# Patient Record
Sex: Male | Born: 1970 | Race: White | Hispanic: No | Marital: Married | Smoking: Never smoker
Health system: Southern US, Community
[De-identification: ages and names within clinical notes are randomized; demographics above are authoritative.]

## PROBLEM LIST (undated history)

## (undated) DIAGNOSIS — E119 Type 2 diabetes mellitus without complications: Secondary | ICD-10-CM

## (undated) DIAGNOSIS — R413 Other amnesia: Secondary | ICD-10-CM

## (undated) DIAGNOSIS — I10 Essential (primary) hypertension: Secondary | ICD-10-CM

## (undated) DIAGNOSIS — R519 Headache, unspecified: Secondary | ICD-10-CM

## (undated) DIAGNOSIS — M1990 Unspecified osteoarthritis, unspecified site: Secondary | ICD-10-CM

## (undated) DIAGNOSIS — G629 Polyneuropathy, unspecified: Secondary | ICD-10-CM

## (undated) DIAGNOSIS — F419 Anxiety disorder, unspecified: Secondary | ICD-10-CM

## (undated) DIAGNOSIS — R51 Headache: Secondary | ICD-10-CM

## (undated) DIAGNOSIS — G4730 Sleep apnea, unspecified: Secondary | ICD-10-CM

## (undated) HISTORY — PX: CHOLECYSTECTOMY: SHX55

## (undated) HISTORY — DX: Polyneuropathy, unspecified: G62.9

## (undated) HISTORY — DX: Essential (primary) hypertension: I10

## (undated) HISTORY — DX: Anxiety disorder, unspecified: F41.9

## (undated) HISTORY — DX: Headache: R51

## (undated) HISTORY — DX: Type 2 diabetes mellitus without complications: E11.9

## (undated) HISTORY — DX: Headache, unspecified: R51.9

## (undated) HISTORY — PX: KNEE ARTHROSCOPY: SUR90

---

## 2006-02-23 ENCOUNTER — Ambulatory Visit: Payer: Self-pay | Admitting: Internal Medicine

## 2006-05-20 ENCOUNTER — Ambulatory Visit: Payer: Self-pay | Admitting: Gastroenterology

## 2006-10-27 ENCOUNTER — Ambulatory Visit: Payer: Self-pay | Admitting: Unknown Physician Specialty

## 2006-11-01 ENCOUNTER — Ambulatory Visit: Payer: Self-pay | Admitting: Unknown Physician Specialty

## 2008-10-17 ENCOUNTER — Ambulatory Visit: Payer: Self-pay | Admitting: Internal Medicine

## 2008-12-07 DIAGNOSIS — E782 Mixed hyperlipidemia: Secondary | ICD-10-CM | POA: Insufficient documentation

## 2009-08-29 DIAGNOSIS — R0602 Shortness of breath: Secondary | ICD-10-CM | POA: Insufficient documentation

## 2010-10-27 DIAGNOSIS — M5412 Radiculopathy, cervical region: Secondary | ICD-10-CM | POA: Insufficient documentation

## 2010-11-07 DIAGNOSIS — M5090 Cervical disc disorder, unspecified, unspecified cervical region: Secondary | ICD-10-CM | POA: Insufficient documentation

## 2010-11-14 DIAGNOSIS — G4733 Obstructive sleep apnea (adult) (pediatric): Secondary | ICD-10-CM | POA: Insufficient documentation

## 2011-03-06 DIAGNOSIS — R413 Other amnesia: Secondary | ICD-10-CM | POA: Insufficient documentation

## 2011-05-20 DIAGNOSIS — M542 Cervicalgia: Secondary | ICD-10-CM | POA: Insufficient documentation

## 2012-12-20 DIAGNOSIS — F4322 Adjustment disorder with anxiety: Secondary | ICD-10-CM | POA: Insufficient documentation

## 2012-12-28 DIAGNOSIS — M545 Low back pain, unspecified: Secondary | ICD-10-CM | POA: Insufficient documentation

## 2012-12-28 DIAGNOSIS — E1140 Type 2 diabetes mellitus with diabetic neuropathy, unspecified: Secondary | ICD-10-CM | POA: Insufficient documentation

## 2013-03-31 DIAGNOSIS — G988 Other disorders of nervous system: Secondary | ICD-10-CM | POA: Insufficient documentation

## 2014-02-28 DIAGNOSIS — IMO0002 Reserved for concepts with insufficient information to code with codable children: Secondary | ICD-10-CM | POA: Insufficient documentation

## 2014-06-26 DIAGNOSIS — IMO0002 Reserved for concepts with insufficient information to code with codable children: Secondary | ICD-10-CM | POA: Insufficient documentation

## 2014-10-18 DIAGNOSIS — R7989 Other specified abnormal findings of blood chemistry: Secondary | ICD-10-CM | POA: Insufficient documentation

## 2014-10-18 DIAGNOSIS — R079 Chest pain, unspecified: Secondary | ICD-10-CM | POA: Insufficient documentation

## 2014-10-18 DIAGNOSIS — R778 Other specified abnormalities of plasma proteins: Secondary | ICD-10-CM | POA: Insufficient documentation

## 2014-10-25 DIAGNOSIS — B3322 Viral myocarditis: Secondary | ICD-10-CM | POA: Insufficient documentation

## 2014-10-25 DIAGNOSIS — K120 Recurrent oral aphthae: Secondary | ICD-10-CM | POA: Insufficient documentation

## 2015-09-06 DIAGNOSIS — G894 Chronic pain syndrome: Secondary | ICD-10-CM | POA: Insufficient documentation

## 2015-09-06 DIAGNOSIS — M159 Polyosteoarthritis, unspecified: Secondary | ICD-10-CM | POA: Insufficient documentation

## 2015-09-06 DIAGNOSIS — I10 Essential (primary) hypertension: Secondary | ICD-10-CM | POA: Insufficient documentation

## 2015-09-06 DIAGNOSIS — F449 Dissociative and conversion disorder, unspecified: Secondary | ICD-10-CM | POA: Insufficient documentation

## 2015-10-01 DIAGNOSIS — M25569 Pain in unspecified knee: Secondary | ICD-10-CM | POA: Insufficient documentation

## 2015-10-03 ENCOUNTER — Other Ambulatory Visit: Payer: Self-pay | Admitting: Unknown Physician Specialty

## 2015-10-03 DIAGNOSIS — M25561 Pain in right knee: Secondary | ICD-10-CM

## 2015-10-03 DIAGNOSIS — G8929 Other chronic pain: Secondary | ICD-10-CM

## 2015-10-03 DIAGNOSIS — M25562 Pain in left knee: Secondary | ICD-10-CM

## 2015-10-24 ENCOUNTER — Ambulatory Visit
Admission: RE | Admit: 2015-10-24 | Discharge: 2015-10-24 | Disposition: A | Discharge status: Home / Self Care | Payer: Medicare HMO | Source: Ambulatory Visit | Attending: Unknown Physician Specialty | Admitting: Unknown Physician Specialty

## 2015-10-24 DIAGNOSIS — M25561 Pain in right knee: Secondary | ICD-10-CM

## 2015-10-24 DIAGNOSIS — M25562 Pain in left knee: Secondary | ICD-10-CM

## 2015-10-24 DIAGNOSIS — M1711 Unilateral primary osteoarthritis, right knee: Secondary | ICD-10-CM | POA: Insufficient documentation

## 2015-10-24 DIAGNOSIS — G8929 Other chronic pain: Secondary | ICD-10-CM | POA: Diagnosis present

## 2015-10-24 DIAGNOSIS — M1712 Unilateral primary osteoarthritis, left knee: Secondary | ICD-10-CM | POA: Diagnosis not present

## 2015-11-28 ENCOUNTER — Ambulatory Visit (INDEPENDENT_AMBULATORY_CARE_PROVIDER_SITE_OTHER): Payer: Self-pay | Admitting: Psychiatry

## 2016-01-08 ENCOUNTER — Other Ambulatory Visit: Payer: Self-pay | Admitting: Psychiatry

## 2016-01-08 ENCOUNTER — Encounter: Payer: Self-pay | Admitting: Psychiatry

## 2016-01-08 ENCOUNTER — Ambulatory Visit (INDEPENDENT_AMBULATORY_CARE_PROVIDER_SITE_OTHER): Payer: Medicare HMO | Admitting: Psychiatry

## 2016-01-08 VITALS — BP 140/78 | HR 92 | Temp 98.2°F | Ht 70.0 in | Wt 262.2 lb

## 2016-01-08 DIAGNOSIS — F331 Major depressive disorder, recurrent, moderate: Secondary | ICD-10-CM | POA: Diagnosis not present

## 2016-01-08 DIAGNOSIS — E785 Hyperlipidemia, unspecified: Secondary | ICD-10-CM | POA: Insufficient documentation

## 2016-01-08 DIAGNOSIS — F449 Dissociative and conversion disorder, unspecified: Secondary | ICD-10-CM

## 2016-01-08 MED ORDER — LAMOTRIGINE 25 MG PO TABS: 25.0000 mg | ORAL_TABLET | Freq: Every day | ORAL | Status: DC

## 2016-01-08 MED ORDER — TRAZODONE HCL 100 MG PO TABS: 100.0000 mg | ORAL_TABLET | Freq: Every day | ORAL | Status: DC

## 2016-01-08 NOTE — Progress Notes (Signed)
Psychiatric Initial Adult Assessment   Patient Identification: Brian Howell MRN:  [id number] Date of Evaluation:  01/08/2016 Referral Source: PCP Chief Complaint:   Chief Complaint    Establish Care; Anxiety     Visit Diagnosis:    ICD-9-CM ICD-10-CM   1. MDD (major depressive disorder), recurrent episode, moderate (HCC) 296.32 F33.1   2. 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 He reported that he was referred by his primary care physician. Patient reported that he has history of memory issues and he was diagnosed with dissociative disorder in 2010 after he fell off the truck. Initial history was provided by his wife but then the patient started providing the history. Patient stated that he has recently started following Dr. Doy Hutching after he has received disability for his memory problems. He was also following Dr. Johnn Hai at [facility] for the past one year who has given him Ritalin due to his low energy. He stated that he has problems with his memory and he will lose portions of his memory at times. He will go back in time and will talk about past when he will have episodes of dissociation. Patient reported that he will not have issues with memory most of the time but will have episodes lasting for 2-2-1/2 hour then he will talk about past, does not remember the names of his family members and will be lost. Patient reported that he has never gone out of the city while driving. He reported that he currently remembers his name age date of birth. He has never assumed a new identity and started a new relationship. Patient does not have multiple personalities. He also has never traveled out of state all gone too far from his home. He reported that he is able to drive by himself with the help of GPS. He was seen at [facility] for many years and they have diagnosed  him with depression and he was seeing a therapist over there as well.  His wife reported that they were sending him to a neurologist who did multiple test and only diagnosed him with dissociative disorder. Patient denied any history of seizures or suicide attempts.  Associated Signs/Symptoms: Depression Symptoms:  anhedonia, fatigue, difficulty concentrating, hopelessness, impaired memory, (Hypo) Manic Symptoms:  Labiality of Mood, Anxiety Symptoms:  Excessive Worry, Psychotic Symptoms:  none PTSD Symptoms: Negative NA  Past Psychiatric History:  Patient has history of dissociative disorder, depression and anxiety. He does not have any history of suicide attempts. He was seen at [facility] in the past. His last psychiatrist was Dr. Jacqualine Code. He was prescribed Zoloft Remeron and Abilify in the past.  Previous Psychotropic Medications:   He was prescribed Zoloft Remeron and Abilify in the past   Substance Abuse History in the last 12 months:  No.  Consequences of Substance Abuse: Negative NA  Past Medical History:  Past Medical History  Diagnosis Date  . Neuropathy ([place])   . Anxiety   . Diabetes mellitus, type II ([place])   . Headache   . Hypertension     Past Surgical History  Procedure Laterality Date  . Cholecystectomy      Family Psychiatric History:  History of bipolar disorder  Family History:  Family History  Problem Relation Age of Onset  . Anxiety disorder Mother   . Depression Mother   . Diabetes Mother   .  Heart attack Father   . Stroke Father   . Bipolar disorder Sister   . Diabetes Sister   . Diabetes Brother   . Diabetes Sister   . Stroke Sister   . Heart attack Sister     Social History:   Social History   Social History  . Marital Status: Married    Spouse Name: N/A  . Number of Children: N/A  . Years of Education: N/A   Social History Main Topics  . Smoking status: Never Smoker   . Smokeless tobacco: Never Used  .  Alcohol Use: No  . Drug Use: No  . Sexual Activity: No   Other Topics Concern  . None   Social History Narrative  . None    Additional Social History:   married for 29 years. He has 4 children ages 64, 85, 53 and 20 years old. He reported that he used to work in [employer]. Currently he is on disability and spends time watching TV.  Allergies:   Allergies  Allergen Reactions  . Ace Inhibitors Swelling    Metabolic Disorder Labs: No results found for: HGBA1C, MPG No results found for: PROLACTIN No results found for: CHOL, TRIG, HDL, CHOLHDL, VLDL, LDLCALC   Current Medications: Current Outpatient Prescriptions  Medication Sig Dispense Refill  . aspirin 81 MG tablet Take 81 mg by mouth.    . canagliflozin (INVOKANA) 300 MG TABS tablet Take by mouth.    . DULoxetine (CYMBALTA) 30 MG capsule Take by mouth.    . gabapentin (NEURONTIN) 300 MG capsule Take 900 mg by mouth 3 (three) times daily.    Marland Kitchen glucose blood (FREESTYLE INSULINX TEST) test strip     . HYDROcodone-acetaminophen (NORCO/VICODIN) 5-325 MG tablet TK 2 TS PO Q 8 H PRN P  0  . insulin NPH-regular Human (HUMULIN 70/30) (70-30) 100 UNIT/ML injection Inject into the skin.    . Insulin Syringe-Needle U-100 (INSULIN SYRINGE .3CC/29GX1/2") 29G X 1/2" 0.3 ML MISC     . lidocaine (XYLOCAINE) 2 % solution 20 mg.    . metFORMIN (GLUCOPHAGE) 1000 MG tablet Take 1,000 mg by mouth 2 (two) times daily.    . methylphenidate (RITALIN) 20 MG tablet Take 20 mg by mouth. Take one and half tablet    . ONETOUCH DELICA LANCETS FINE MISC     . traZODone (DESYREL) 50 MG tablet Take by mouth.     No current facility-administered medications for this visit.    Neurologic: Headache: No Seizure: No Paresthesias:No  Musculoskeletal: Strength & Muscle Tone: within normal limits Gait & Station: normal Patient leans: N/A  Psychiatric Specialty Exam: ROS  Blood pressure 140/78, pulse 92, temperature 98.2 F (36.8 C), temperature  source Tympanic, height 5\' 10"  (1.778 m), weight 262 lb 3.2 oz (118.933 kg), SpO2 94 %.Body mass index is 37.62 kg/(m^2).  General Appearance: Casual  Eye Contact:  Fair  Speech:  Slow  Volume:  Normal  Mood:  Anxious  Affect:  Congruent  Thought Process:  Goal Directed  Orientation:  Full (Time, Place, and Person)  Thought Content:  WDL  Suicidal Thoughts:  No  Homicidal Thoughts:  No  Memory:  Immediate;   Fair  Judgement:  Fair  Insight:  Fair  Psychomotor Activity:  Normal  Concentration:  Fair  Recall:  [employer] of [place]  Language: Fair  Akathisia:  No  Handed:  Right  AIMS (if indicated):    Assets:  Communication Skills Leisure Time Physical Health  Social Support  ADL's:  Intact  Cognition: WNL  Sleep:      Treatment Plan Summary: Medication management Patient does not seem to have any acute memory problems at this time. I will start him on lamotrigine 25 mg daily to help with his mood symptoms. I will titrate trazodone 100 mg at bedtime to help with his insomnia I also reviewed his records including his old records from his [facility] He will sign a release of information from [facility] Discussed with him about the medications in detail and he demonstrated understanding Follow-up in 3 weeks or earlier   More than 50% of the time spent in psychoeducation, counseling and coordination of care.    This note was generated in part or whole with voice recognition software. Voice regonition is usually quite accurate but there are transcription errors that can and very often do occur. I apologize for any typographical errors that were not detected and corrected.   Rainey Pines, MD 5/17/20171:30 PM

## 2016-01-29 ENCOUNTER — Other Ambulatory Visit: Payer: Self-pay | Admitting: Psychiatry

## 2016-01-29 ENCOUNTER — Encounter: Payer: Self-pay | Admitting: Psychiatry

## 2016-01-29 ENCOUNTER — Ambulatory Visit (INDEPENDENT_AMBULATORY_CARE_PROVIDER_SITE_OTHER): Payer: Medicare HMO | Admitting: Psychiatry

## 2016-01-29 VITALS — BP 145/87 | HR 89 | Temp 98.3°F | Ht 71.0 in | Wt 262.8 lb

## 2016-01-29 DIAGNOSIS — F331 Major depressive disorder, recurrent, moderate: Secondary | ICD-10-CM | POA: Diagnosis not present

## 2016-01-29 DIAGNOSIS — F449 Dissociative and conversion disorder, unspecified: Secondary | ICD-10-CM | POA: Diagnosis not present

## 2016-01-29 MED ORDER — TRAZODONE HCL 100 MG PO TABS: 100.0000 mg | ORAL_TABLET | Freq: Every evening | ORAL | Status: DC | PRN

## 2016-01-29 MED ORDER — ESCITALOPRAM OXALATE 10 MG PO TABS: 10.0000 mg | ORAL_TABLET | Freq: Every day | ORAL | Status: DC

## 2016-01-29 MED ORDER — LAMOTRIGINE 25 MG PO TABS: 50.0000 mg | ORAL_TABLET | Freq: Every day | ORAL | Status: DC

## 2016-01-29 NOTE — Progress Notes (Signed)
Psychiatric MD Progress Note   Patient Identification: Brian Howell MRN:  [id number] Date of Evaluation:  01/29/2016 Referral Source: PCP Chief Complaint:    Visit Diagnosis:    ICD-9-CM ICD-10-CM   1. MDD (major depressive disorder), recurrent episode, moderate (HCC) 296.32 F33.1   2. 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  He reported that he was referred by his primary care physician. Patient reported that he has history of memory issues and he Continues to feel that he is in a"  glass box." He was diagnosed with dissociative disorder in 2010 after he fell off the truck. He reported that he has problems with his memory but he was able to talk about his day  and was able to provide me information how he spends his time at home. Patient also told me that he was taking Ritalin and Zoloft in the past. He reported that he enjoys cooking at home for sometime he will put extra salt in the food and everybody will tell him that he is putting extra salt. He reported that he used to cope in the past and he continues to enjoy cooking. He reported that he also stays longer in the bed as he has nothing to do.  He stated that he spends most of the time on the computer and sometimes he likes to stay her in the space.  His wife has been taking him to the restaurant 3-4 times per week and occasionally he will feel paranoid as if everybody is looking at him. He feels anxious in social situations and his wife has been encouraging him that he should go out more often.  Patient does not seem to have any memory issues as he has been able to provide most of the history by himself.  He reported that he was prescribed Ritalin in the past . He currently sleeps well with the help of gabapentin and trazodone.  He currently denied having any suicidal ideations or plans.  Associated Signs/Symptoms: Depression Symptoms:   anhedonia, fatigue, difficulty concentrating, hopelessness, impaired memory, (Hypo) Manic Symptoms:  Labiality of Mood, Anxiety Symptoms:  Excessive Worry, Psychotic Symptoms:  none PTSD Symptoms: Negative NA  Past Psychiatric History:  Patient has history of dissociative disorder, depression and anxiety. He does not have any history of suicide attempts. He was seen at [facility] in the past. His last psychiatrist was Dr. Jacqualine Code. He was prescribed Zoloft Remeron and Abilify in the past.  Previous Psychotropic Medications:   He was prescribed Zoloft Remeron and Abilify in the past   Substance Abuse History in the last 12 months:  No.  Consequences of Substance Abuse: Negative NA  Past Medical History:  Past Medical History  Diagnosis Date  . Neuropathy ([place])   . Anxiety   . Diabetes mellitus, type II ([place])   . Headache   . Hypertension     Past Surgical History  Procedure Laterality Date  . Cholecystectomy      Family Psychiatric History:  History of bipolar disorder  Family History:  Family History  Problem Relation Age of Onset  . Anxiety disorder Mother   . Depression Mother   . Diabetes Mother   . Heart attack Father   . Stroke Father   . Bipolar disorder Sister   . Diabetes Sister   . Diabetes Brother   . Diabetes Sister   . Stroke  Sister   . Heart attack Sister     Social History:   Social History   Social History  . Marital Status: Married    Spouse Name: N/A  . Number of Children: N/A  . Years of Education: N/A   Social History Main Topics  . Smoking status: Never Smoker   . Smokeless tobacco: Never Used  . Alcohol Use: No  . Drug Use: No  . Sexual Activity: No   Other Topics Concern  . None   Social History Narrative    Additional Social History:   married for 29 years. He has 4 children ages 15, 60, 2 and 37 years old. He reported that he used to work in [employer]. Currently he is on disability and spends time  watching TV.  Allergies:   Allergies  Allergen Reactions  . Ace Inhibitors Swelling    Metabolic Disorder Labs: No results found for: HGBA1C, MPG No results found for: PROLACTIN No results found for: CHOL, TRIG, HDL, CHOLHDL, VLDL, LDLCALC   Current Medications: Current Outpatient Prescriptions  Medication Sig Dispense Refill  . aspirin 81 MG tablet Take 81 mg by mouth.    . canagliflozin (INVOKANA) 300 MG TABS tablet Take by mouth.    . gabapentin (NEURONTIN) 300 MG capsule Take 900 mg by mouth 3 (three) times daily.    Marland Kitchen glucose blood (FREESTYLE INSULINX TEST) test strip     . HYDROcodone-acetaminophen (NORCO/VICODIN) 5-325 MG tablet TK 2 TS PO Q 8 H PRN P  0  . insulin NPH-regular Human (HUMULIN 70/30) (70-30) 100 UNIT/ML injection Inject into the skin.    . Insulin Syringe-Needle U-100 (INSULIN SYRINGE .3CC/29GX1/2") 29G X 1/2" 0.3 ML MISC     . lamoTRIgine (LAMICTAL) 25 MG tablet Take 1 tablet (25 mg total) by mouth daily. 30 tablet 0  . lidocaine (XYLOCAINE) 2 % solution 20 mg.    . metFORMIN (GLUCOPHAGE) 1000 MG tablet Take 1,000 mg by mouth 2 (two) times daily.    Glory Rosebush DELICA LANCETS FINE MISC     . traZODone (DESYREL) 100 MG tablet Take 1 tablet (100 mg total) by mouth at bedtime. 30 tablet 0   No current facility-administered medications for this visit.    Neurologic: Headache: No Seizure: No Paresthesias:No  Musculoskeletal: Strength & Muscle Tone: within normal limits Gait & Station: normal Patient leans: N/A  Psychiatric Specialty Exam: ROS   Blood pressure 145/87, pulse 89, temperature 98.3 F (36.8 C), height 5\' 11"  (1.803 m), weight 262 lb 12.8 oz (119.205 kg), SpO2 95 %.Body mass index is 36.67 kg/(m^2).  General Appearance: Casual  Eye Contact:  Fair  Speech:  Slow  Volume:  Normal  Mood:  Anxious  Affect:  Congruent  Thought Process:  Goal Directed  Orientation:  Full (Time, Place, and Person)  Thought Content:  WDL  Suicidal Thoughts:   No  Homicidal Thoughts:  No  Memory:  Immediate;   Fair  Judgement:  Fair  Insight:  Fair  Psychomotor Activity:  Normal  Concentration:  Fair  Recall:  [employer] of Knowledge:Fair  Language: Fair  Akathisia:  No  Handed:  Right  AIMS (if indicated):    Assets:  Communication Skills Leisure Time Physical Health Social Support  ADL's:  Intact  Cognition: WNL  Sleep:      Treatment Plan Summary: Medication management  I will start him on lamotrigine 50  mg daily to help with his mood symptoms. I will titrate trazodone  100 mg at bedtime to help with his insomnia I will start him on Lexapro 10 mg daily to help with his depression and anxiety symptoms.   Discussed with him about the medications in detail and he demonstrated understanding Follow-up in 2 months  or earlier   More than 50% of the time spent in psychoeducation, counseling and coordination of care.    This note was generated in part or whole with voice recognition software. Voice regonition is usually quite accurate but there are transcription errors that can and very often do occur. I apologize for any typographical errors that were not detected and corrected.   Rainey Pines, MD 6/7/20172:42 PM

## 2016-01-31 ENCOUNTER — Other Ambulatory Visit: Payer: Self-pay | Admitting: Psychiatry

## 2016-03-23 ENCOUNTER — Other Ambulatory Visit: Payer: Self-pay | Admitting: Psychiatry

## 2016-03-23 ENCOUNTER — Other Ambulatory Visit: Payer: Self-pay

## 2016-03-23 MED ORDER — ESCITALOPRAM OXALATE 10 MG PO TABS: 10.0000 mg | ORAL_TABLET | Freq: Every day | ORAL | 0 refills | Status: DC

## 2016-03-23 NOTE — Telephone Encounter (Signed)
pt needs refill on lexapro pt will not have enough medication to lastpt needs refill on lexapro pt will not have enough medication to last until appt.  until appt.

## 2016-04-06 ENCOUNTER — Ambulatory Visit: Payer: Medicare HMO | Admitting: Psychiatry

## 2016-04-10 ENCOUNTER — Other Ambulatory Visit: Payer: Self-pay | Admitting: Internal Medicine

## 2016-04-10 DIAGNOSIS — R55 Syncope and collapse: Secondary | ICD-10-CM

## 2016-04-21 ENCOUNTER — Ambulatory Visit: Payer: Medicare HMO

## 2016-04-29 ENCOUNTER — Ambulatory Visit: Payer: Medicare HMO

## 2016-05-05 ENCOUNTER — Other Ambulatory Visit: Payer: Self-pay | Admitting: Physical Medicine and Rehabilitation

## 2016-05-05 DIAGNOSIS — M5417 Radiculopathy, lumbosacral region: Secondary | ICD-10-CM

## 2016-05-14 ENCOUNTER — Ambulatory Visit
Admission: RE | Admit: 2016-05-14 | Discharge: 2016-05-14 | Disposition: A | Discharge status: Home / Self Care | Payer: Medicare HMO | Source: Ambulatory Visit | Attending: Physical Medicine and Rehabilitation | Admitting: Physical Medicine and Rehabilitation

## 2016-05-14 DIAGNOSIS — M5416 Radiculopathy, lumbar region: Secondary | ICD-10-CM | POA: Insufficient documentation

## 2016-05-14 DIAGNOSIS — M4806 Spinal stenosis, lumbar region: Secondary | ICD-10-CM | POA: Insufficient documentation

## 2016-05-14 DIAGNOSIS — M5126 Other intervertebral disc displacement, lumbar region: Secondary | ICD-10-CM | POA: Diagnosis not present

## 2016-05-14 DIAGNOSIS — M5136 Other intervertebral disc degeneration, lumbar region: Secondary | ICD-10-CM | POA: Insufficient documentation

## 2016-05-14 DIAGNOSIS — M5417 Radiculopathy, lumbosacral region: Secondary | ICD-10-CM

## 2017-03-09 ENCOUNTER — Other Ambulatory Visit: Payer: Self-pay | Admitting: Internal Medicine

## 2017-03-09 DIAGNOSIS — R0602 Shortness of breath: Secondary | ICD-10-CM

## 2017-12-14 ENCOUNTER — Other Ambulatory Visit (HOSPITAL_COMMUNITY): Payer: Self-pay | Admitting: Nurse Practitioner

## 2017-12-14 DIAGNOSIS — G3184 Mild cognitive impairment, so stated: Secondary | ICD-10-CM

## 2017-12-21 ENCOUNTER — Ambulatory Visit (HOSPITAL_COMMUNITY)
Admission: RE | Admit: 2017-12-21 | Discharge: 2017-12-21 | Disposition: A | Discharge status: Home / Self Care | Payer: Medicare HMO | Source: Ambulatory Visit | Attending: Nurse Practitioner | Admitting: Nurse Practitioner

## 2017-12-21 DIAGNOSIS — G3184 Mild cognitive impairment, so stated: Secondary | ICD-10-CM | POA: Diagnosis not present

## 2017-12-22 ENCOUNTER — Other Ambulatory Visit: Payer: Self-pay | Admitting: Internal Medicine

## 2017-12-22 DIAGNOSIS — R140 Abdominal distension (gaseous): Secondary | ICD-10-CM

## 2018-01-07 ENCOUNTER — Ambulatory Visit
Admission: RE | Admit: 2018-01-07 | Discharge: 2018-01-07 | Disposition: A | Discharge status: Home / Self Care | Payer: Medicare HMO | Source: Ambulatory Visit | Attending: Internal Medicine | Admitting: Internal Medicine

## 2018-01-07 ENCOUNTER — Other Ambulatory Visit: Payer: Self-pay | Admitting: Internal Medicine

## 2018-01-07 DIAGNOSIS — Z9049 Acquired absence of other specified parts of digestive tract: Secondary | ICD-10-CM | POA: Insufficient documentation

## 2018-01-07 DIAGNOSIS — R140 Abdominal distension (gaseous): Secondary | ICD-10-CM | POA: Insufficient documentation

## 2018-01-07 DIAGNOSIS — R109 Unspecified abdominal pain: Secondary | ICD-10-CM | POA: Diagnosis present

## 2018-09-01 ENCOUNTER — Other Ambulatory Visit: Payer: Self-pay | Admitting: Podiatry

## 2018-09-01 ENCOUNTER — Other Ambulatory Visit (HOSPITAL_COMMUNITY): Payer: Self-pay | Admitting: Podiatry

## 2018-09-01 DIAGNOSIS — S86001D Unspecified injury of right Achilles tendon, subsequent encounter: Secondary | ICD-10-CM

## 2018-09-11 ENCOUNTER — Encounter (INDEPENDENT_AMBULATORY_CARE_PROVIDER_SITE_OTHER): Payer: Self-pay

## 2018-09-11 ENCOUNTER — Ambulatory Visit
Admission: RE | Admit: 2018-09-11 | Discharge: 2018-09-11 | Disposition: A | Discharge status: Home / Self Care | Payer: Medicare HMO | Source: Ambulatory Visit | Attending: Podiatry | Admitting: Podiatry

## 2018-09-11 DIAGNOSIS — S86001D Unspecified injury of right Achilles tendon, subsequent encounter: Secondary | ICD-10-CM | POA: Insufficient documentation

## 2019-04-05 ENCOUNTER — Other Ambulatory Visit: Payer: Self-pay | Admitting: Internal Medicine

## 2019-04-05 DIAGNOSIS — G959 Disease of spinal cord, unspecified: Secondary | ICD-10-CM

## 2019-04-19 ENCOUNTER — Other Ambulatory Visit: Payer: Self-pay

## 2019-04-19 ENCOUNTER — Ambulatory Visit
Admission: RE | Admit: 2019-04-19 | Discharge: 2019-04-19 | Disposition: A | Discharge status: Home / Self Care | Payer: Medicare HMO | Source: Ambulatory Visit | Attending: Internal Medicine | Admitting: Internal Medicine

## 2019-04-19 DIAGNOSIS — G959 Disease of spinal cord, unspecified: Secondary | ICD-10-CM | POA: Diagnosis not present

## 2019-08-27 ENCOUNTER — Other Ambulatory Visit: Payer: Self-pay

## 2019-08-27 ENCOUNTER — Emergency Department
Admission: EM | Admit: 2019-08-27 | Discharge: 2019-08-27 | Disposition: A | Discharge status: Home / Self Care | Payer: Medicare HMO | Attending: Emergency Medicine | Admitting: Emergency Medicine

## 2019-08-27 ENCOUNTER — Encounter: Payer: Self-pay | Admitting: Emergency Medicine

## 2019-08-27 DIAGNOSIS — K0889 Other specified disorders of teeth and supporting structures: Secondary | ICD-10-CM | POA: Insufficient documentation

## 2019-08-27 DIAGNOSIS — R220 Localized swelling, mass and lump, head: Secondary | ICD-10-CM | POA: Insufficient documentation

## 2019-08-27 DIAGNOSIS — Z5321 Procedure and treatment not carried out due to patient leaving prior to being seen by health care provider: Secondary | ICD-10-CM | POA: Diagnosis not present

## 2019-08-27 MED ORDER — ACETAMINOPHEN 325 MG PO TABS
650.0000 mg | ORAL_TABLET | Freq: Once | ORAL | Status: AC | PRN
  Administered 2019-08-27: 650 mg via ORAL
  Filled 2019-08-27: qty 2

## 2019-08-27 NOTE — ED Triage Notes (Signed)
Pt arrived via POV with reports of dental pain and facial swelling x 3 days, pt is diabetic also.  Pt called [facility] yesterday and was prescribed Augmentin, states he took 3 doses, but states its not any better.  Facial swelling present on the right side.

## 2019-08-29 ENCOUNTER — Other Ambulatory Visit: Payer: Self-pay

## 2019-08-29 ENCOUNTER — Ambulatory Visit
Admission: RE | Admit: 2019-08-29 | Discharge: 2019-08-29 | Disposition: A | Discharge status: Home / Self Care | Payer: Medicare HMO | Source: Ambulatory Visit | Attending: Physician Assistant | Admitting: Physician Assistant

## 2019-08-29 ENCOUNTER — Other Ambulatory Visit: Payer: Self-pay | Admitting: Physician Assistant

## 2019-08-29 ENCOUNTER — Other Ambulatory Visit (HOSPITAL_COMMUNITY): Payer: Self-pay | Admitting: Physician Assistant

## 2019-08-29 DIAGNOSIS — L03211 Cellulitis of face: Secondary | ICD-10-CM

## 2019-08-29 DIAGNOSIS — K047 Periapical abscess without sinus: Secondary | ICD-10-CM

## 2019-08-29 LAB — POCT I-STAT CREATININE: Creatinine, Ser: 0.8 mg/dL (ref 0.61–1.24)

## 2019-08-29 MED ORDER — IOHEXOL 300 MG/ML  SOLN
75.0000 mL | Freq: Once | INTRAMUSCULAR | Status: AC | PRN
  Administered 2019-08-29: 75 mL via INTRAVENOUS

## 2019-09-06 ENCOUNTER — Other Ambulatory Visit: Payer: Self-pay

## 2019-09-06 ENCOUNTER — Ambulatory Visit: Payer: Medicare HMO | Admitting: Urology

## 2019-09-06 ENCOUNTER — Encounter: Payer: Self-pay | Admitting: Urology

## 2019-09-06 VITALS — BP 107/70 | HR 68 | Ht 70.0 in | Wt 270.0 lb

## 2019-09-06 DIAGNOSIS — R361 Hematospermia: Secondary | ICD-10-CM

## 2019-09-06 DIAGNOSIS — N5201 Erectile dysfunction due to arterial insufficiency: Secondary | ICD-10-CM | POA: Diagnosis not present

## 2019-09-06 MED ORDER — TADALAFIL 20 MG PO TABS
ORAL_TABLET | ORAL | 0 refills | Status: DC
Start: 2019-09-06 — End: 2019-10-17

## 2019-09-06 NOTE — Progress Notes (Signed)
09/06/2019 11:17 AM   Brian Howell 1971/02/04 [id number]  Referring provider: Medicine, [facility] [address] [place],  [place] [address]  Chief Complaint  Patient presents with  . Other    HPI: Brian Howell is a 49 y.o. male seen in consultation at the request of Dr. Doy Hutching for evaluation hematospermia.  Approximately 2 months ago he noted a brownish discoloration of his semen with increased odor.  There was no blood in the semen per se.  Denies gross hematuria.  He has no bothersome lower urinary tract symptoms.  PSA October 2020 was 0.29.  No pain with ejaculation or pelvic pain.  He does have difficulty achieving and maintaining an erection.  Organic risk factors include diabetes, hypertension and antihypertensive medications.  Denies previous history of urologic problems.   PMH: Past Medical History:  Diagnosis Date  . Anxiety   . Diabetes mellitus, type II ([place])   . Headache   . Hypertension   . Neuropathy     Surgical History: Past Surgical History:  Procedure Laterality Date  . CHOLECYSTECTOMY      Home Medications:  Allergies as of 09/06/2019      Reactions   Ace Inhibitors Swelling   Gabapentin Rash      Medication List       Accurate as of September 06, 2019 11:17 AM. If you have any questions, ask your nurse or doctor.        aspirin 81 MG tablet Take 81 mg by mouth.   escitalopram 10 MG tablet Commonly known as: LEXAPRO Take 1 tablet (10 mg total) by mouth daily.   FreeStyle InsuLinx Test test strip Generic drug: glucose blood   gabapentin 300 MG capsule Commonly known as: NEURONTIN Take 900 mg by mouth 3 (three) times daily.   HumuLIN 70/30 (70-30) 100 UNIT/ML injection Generic drug: insulin NPH-regular Human Inject into the skin.   HYDROcodone-acetaminophen 5-325 MG tablet Commonly known as: NORCO/VICODIN TK 2 TS PO Q 8 H PRN P   INSULIN SYRINGE .3CC/29GX1/2" 29G X 1/2" 0.3 ML Misc   Invokana 300 MG  Tabs tablet Generic drug: canagliflozin Take by mouth.   lamoTRIgine 25 MG tablet Commonly known as: LaMICtal Take 2 tablets (50 mg total) by mouth daily.   lidocaine 2 % solution Commonly known as: XYLOCAINE 20 mg.   losartan 25 MG tablet Commonly known as: COZAAR TAKE 1 TABLET BY MOUTH EVERY DAY   metFORMIN 1000 MG tablet Commonly known as: GLUCOPHAGE Take 1,000 mg by mouth 2 (two) times daily.   OneTouch Delica Lancets Fine Misc   simvastatin 20 MG tablet Commonly known as: ZOCOR TAKE 1 TABLET BY MOUTH EVERY DAY AT NIGHT   traZODone 100 MG tablet Commonly known as: DESYREL TAKE 1 TABLET BY MOUTH AT BEDTIME AS NEEDED FOR SLEEP       Allergies:  Allergies  Allergen Reactions  . Ace Inhibitors Swelling  . Gabapentin Rash    Family History: Family History  Problem Relation Age of Onset  . Anxiety disorder Mother   . Depression Mother   . Diabetes Mother   . Heart attack Father   . Stroke Father   . Bipolar disorder Sister   . Diabetes Sister   . Diabetes Brother   . Diabetes Sister   . Stroke Sister   . Heart attack Sister     Social History:  reports that he has never smoked. He has never used smokeless tobacco. He reports that he does not  drink alcohol or use drugs.  ROS: UROLOGY Frequent Urination?: No Hard to postpone urination?: No Burning/pain with urination?: No Get up at night to urinate?: No Leakage of urine?: No Urine stream starts and stops?: No Trouble starting stream?: Yes Do you have to strain to urinate?: Yes Blood in urine?: No Urinary tract infection?: No Sexually transmitted disease?: No Injury to kidneys or bladder?: No Painful intercourse?: No Weak stream?: No Erection problems?: No Penile pain?: No  Gastrointestinal Nausea?: No Vomiting?: No Indigestion/heartburn?: No Diarrhea?: No Constipation?: No  Constitutional Fever: No Night sweats?: No Weight loss?: No Fatigue?: No  Skin Skin rash/lesions?:  No Itching?: No  Eyes Blurred vision?: No Double vision?: No  Ears/Nose/Throat Sore throat?: No Sinus problems?: No  Hematologic/Lymphatic Swollen glands?: No Easy bruising?: No  Cardiovascular Leg swelling?: No Chest pain?: No  Respiratory Cough?: No Shortness of breath?: No  Endocrine Excessive thirst?: Yes  Musculoskeletal Back pain?: No Joint pain?: No  Neurological Headaches?: No Dizziness?: No  Psychologic Depression?: Yes Anxiety?: Yes  Physical Exam: BP 107/70 (BP Location: Left Arm, Patient Position: Sitting, Cuff Size: Large)   Pulse 68   Ht 5\' 10"  (1.778 m)   Wt 270 lb (122.5 kg)   BMI 38.74 kg/m   Constitutional:  Alert and oriented, No acute distress. HEENT: Onset AT, moist mucus membranes.  Trachea midline, no masses. Cardiovascular: No clubbing, cyanosis, or edema. Respiratory: Normal respiratory effort, no increased work of breathing. GI: Abdomen is soft, nontender, nondistended, no abdominal masses GU: Prostate 30 g, smooth without nodules. Lymph: No cervical or inguinal lymphadenopathy. Skin: No rashes, bruises or suspicious lesions. Neurologic: Grossly intact, no focal deficits, moving all 4 extremities. Psychiatric: Normal mood and affect.   Assessment & Plan:    - Hematospermia 49 y.o. male with brownish discoloration of his semen and no other symptoms.  PSA and DRE are normal.  Increasing the frequency of his ejaculation may resolve the discoloration which he has not done due to his ED.  - Erectile dysfunction He was interested and a PDE 5 inhibitor trial and Rx tadalafil was sent to his pharmacy.   Abbie Sons, [facility] [address], [place],  [address] [phone]

## 2019-09-06 NOTE — Addendum Note (Signed)
Addended by: Kyra Manges on: 09/06/2019 04:38 PM   Modules accepted: Orders

## 2019-10-16 IMAGING — MR MR HEAD W/O CM
10 of 11 series · 39 of 48 positions shown · non-contrast
Comparison: None.

CLINICAL DATA: Mild cognitive impairment with memory loss

EXAM:
MRI HEAD WITHOUT CONTRAST
TECHNIQUE: Multiplanar, multiecho pulse sequences of the brain and surrounding
structures were obtained without intravenous contrast.
Additionally, using NeuroQuant software a 3D volumetric analysis of
the brain was performed and is compared to a normative database
adjusted for age, gender and intracranial volume.

[Series 3: DWI · axial · 3.0mm · 0.94mm/px · z∈[-34,+108]mm · 7 of 100 slices shown (1 of 2)]
[im 1/100]
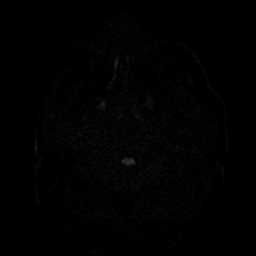
[im 17/100]
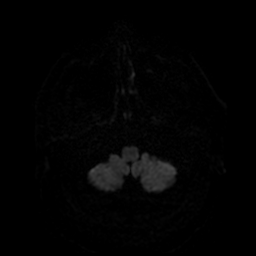
[im 34/100]
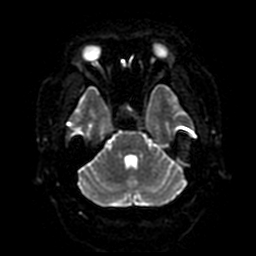
[im 50/100]
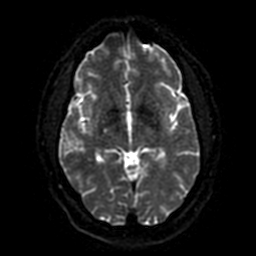
[im 67/100]
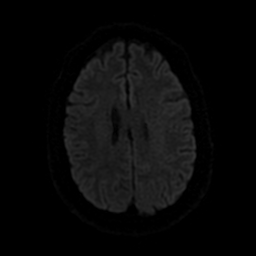
[im 83/100]
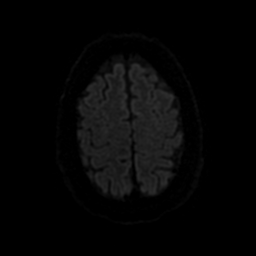
[im 100/100]
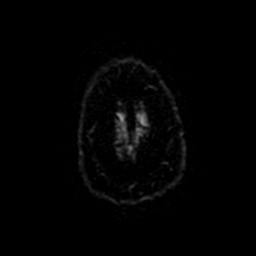

[Series 4: FLAIR · sagittal · 5.0mm · 0.47mm/px · 2 of 23 slices shown (1 of 2)]
[im 1/23]
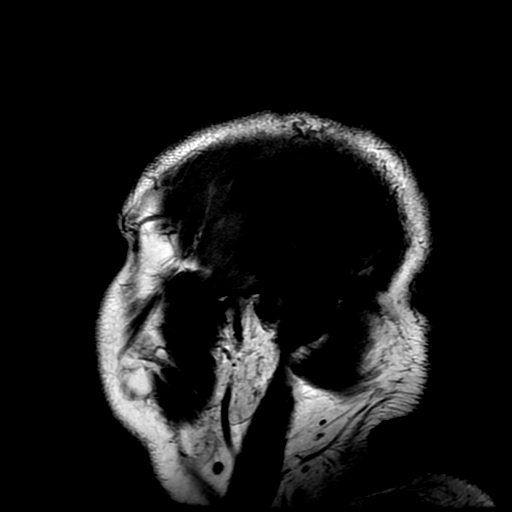
[im 23/23]
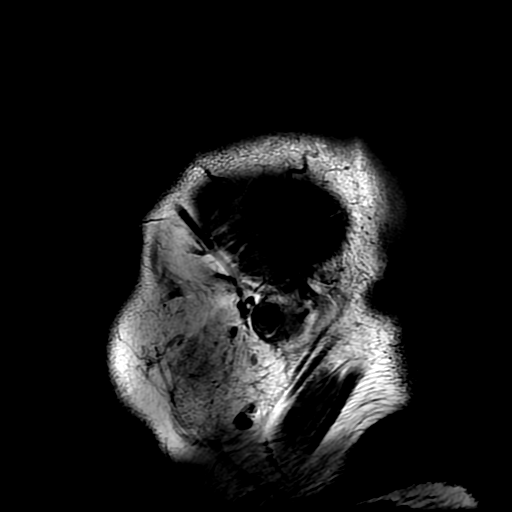

[Series 6: T1 · sagittal · 1.2mm · 0.94mm/px · 8 of 160 slices shown]
[im 1/160]
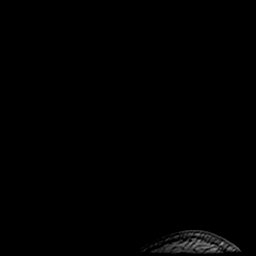
[im 32/160]
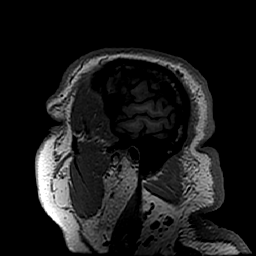
[im 48/160]
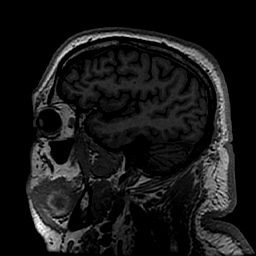
[im 64/160]
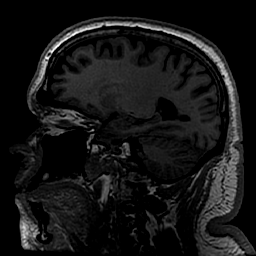
[im 96/160]
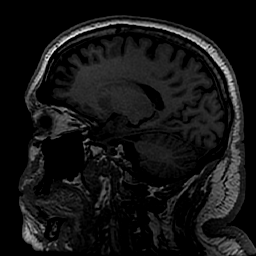
[im 112/160]
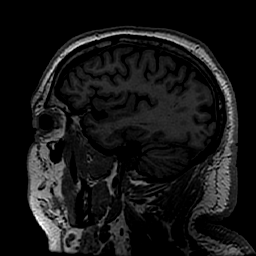
[im 128/160]
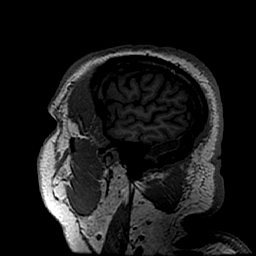
[im 160/160]
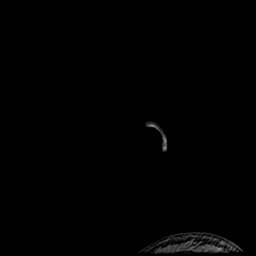

[Series 7: DWI · coronal · 4.0mm · 0.94mm/px · 5 of 72 slices shown (2 of 2)]
[im 1/72]
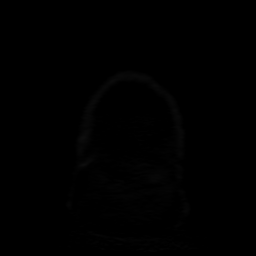
[im 18/72]
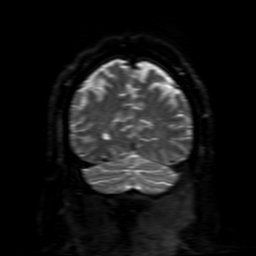
[im 36/72]
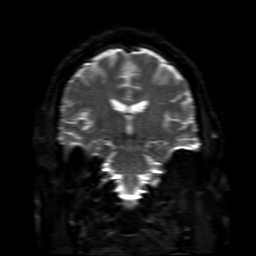
[im 54/72]
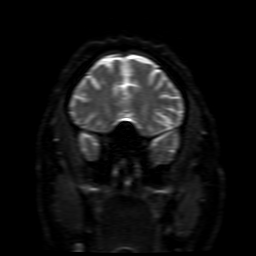
[im 72/72]
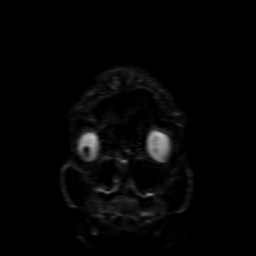

[Series 8: T2 · axial · 5.0mm · 0.47mm/px · z∈[-33,+106]mm · 2 of 25 slices shown (1 of 2)]
[im 1/25]
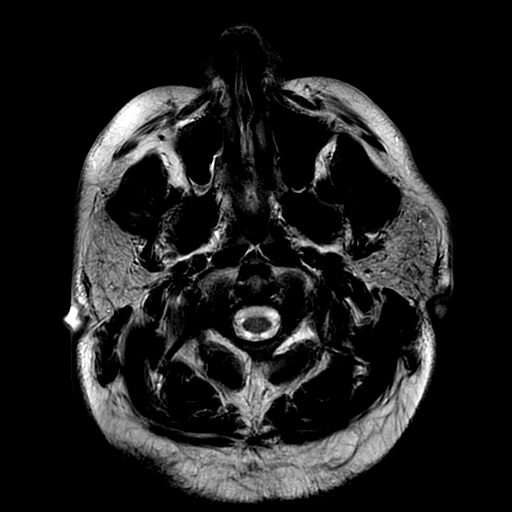
[im 25/25]
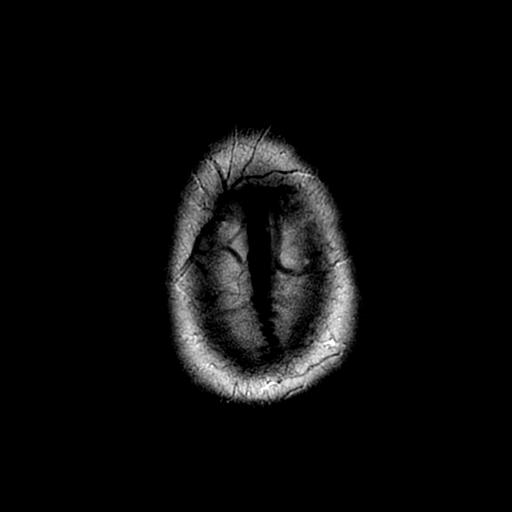

[Series 9: FLAIR · axial · 5.0mm · 0.47mm/px · z∈[-33,+106]mm · 2 of 25 slices shown (2 of 2)]
[im 1/25]
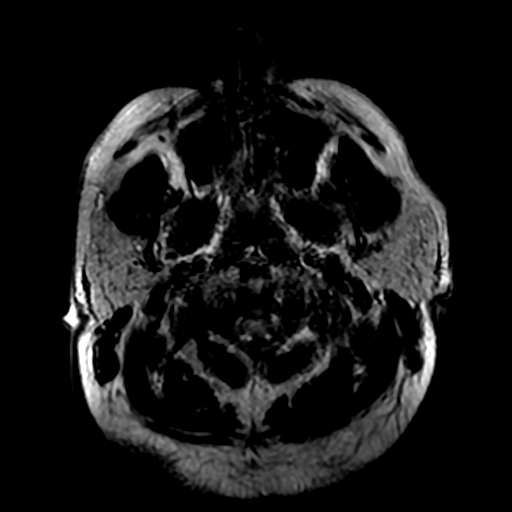
[im 25/25]
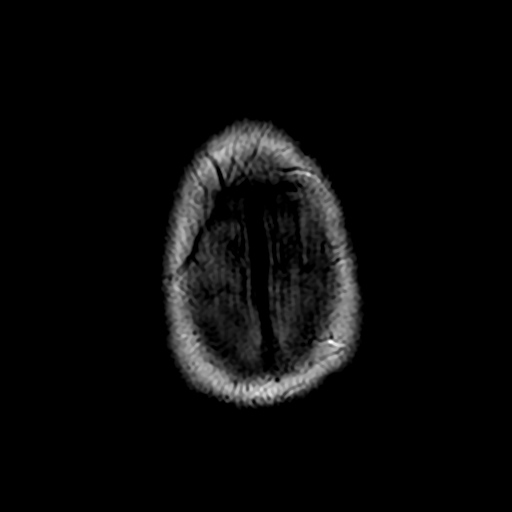

[Series 10: (person_name) · axial · 3.0mm · 0.47mm/px · z∈[-34,+37]mm · 4 of 100 slices shown]
[im 1/100]
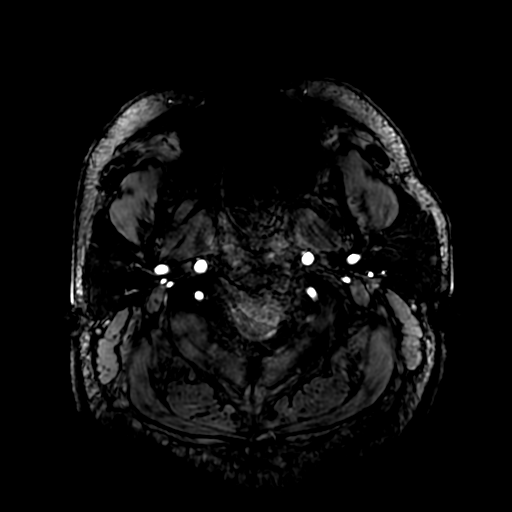
[im 17/100]
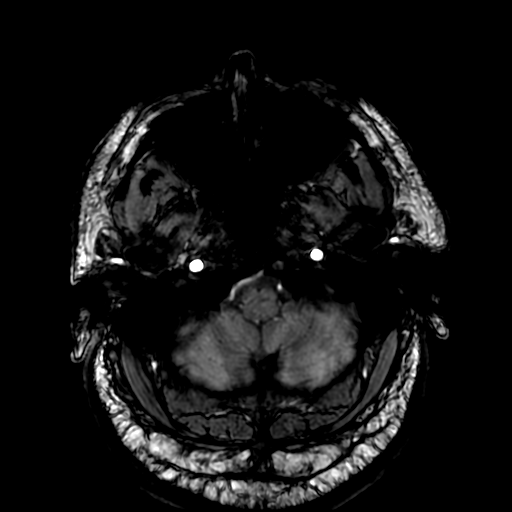
[im 34/100]
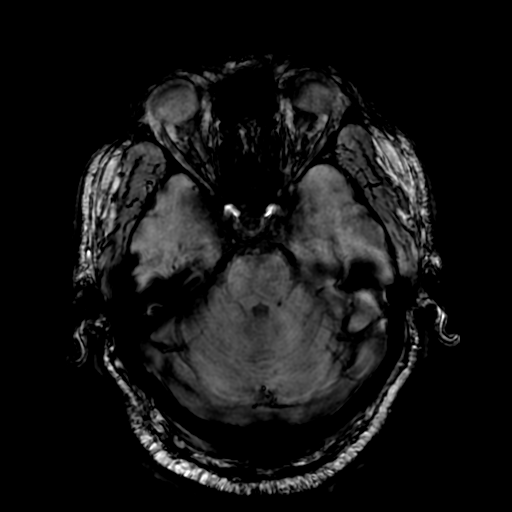
[im 50/100]
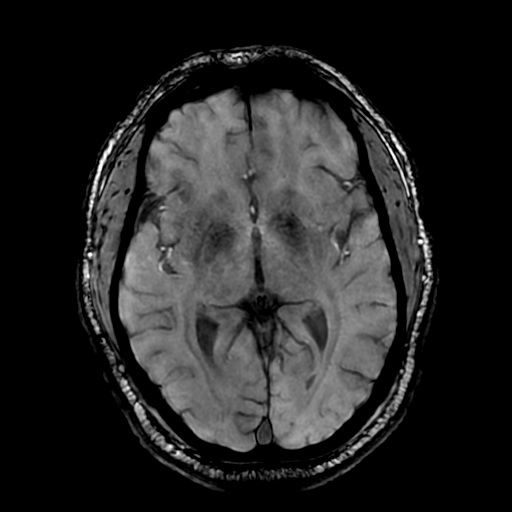

[Series 12: T2 · coronal · 5.0mm · 0.94mm/px · 2 of 30 slices shown (2 of 2)]
[im 1/30]
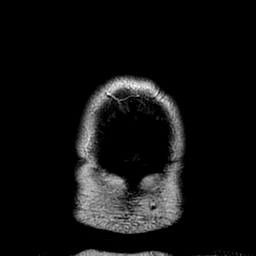
[im 30/30]
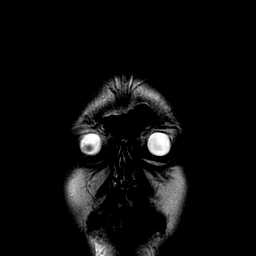

[Series 350: ADC · axial · 3.0mm · 0.94mm/px · z∈[-34,+108]mm · 4 of 49 slices shown (1 of 2)]
[im 1/49]
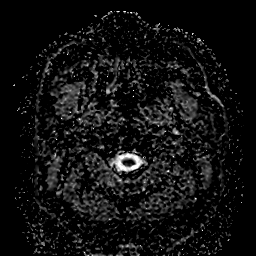
[im 17/49]
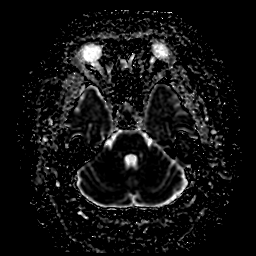
[im 33/49]
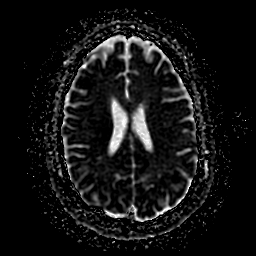
[im 49/49]
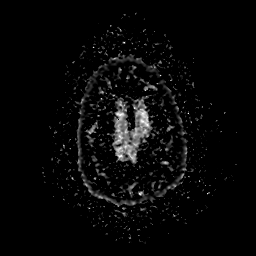

[Series 750: ADC · coronal · 4.0mm · 0.94mm/px · 3 of 36 slices shown (2 of 2)]
[im 1/36]
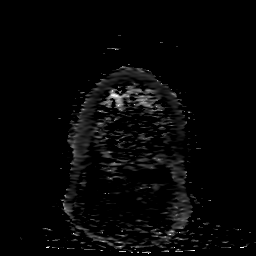
[im 18/36]
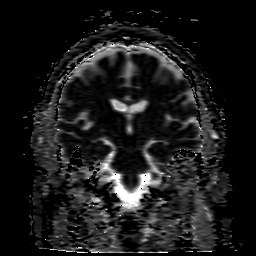
[im 36/36]
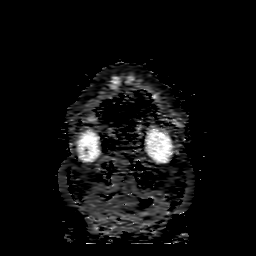

[39 of 48 positions shown; findings below may reference images not displayed]

FINDINGS: BRAIN: The midline structures are normal. There is no acute infarct
or acute hemorrhage. No mass lesion, hydrocephalus, dural
abnormality or extra-axial collection. The brain parenchymal signal
is normal for the patient's age. No chronic microhemorrhage or
superficial siderosis.

VASCULAR: Major intracranial arterial and venous sinus flow voids
are preserved.

SKULL AND UPPER CERVICAL SPINE: The visualized skull base,
calvarium, upper cervical spine and extracranial soft tissues are
normal.

SINUSES/ORBITS: Trace right mastoid fluid.  Normal orbits.

NeuroQuant Findings:

Volumetric analysis of the brain was performed, with a fully
detailed report in [HOSPITAL] PACS. Briefly, the comparison with age and
gender matched reference reveals morphometry results within two
standard deviations of age-matched average. There is artifactual
inclusion of the superior sagittal sinus in cortical volume
calculation.
IMPRESSION: 1. Qualitatively normal appearance of the brain for age.
2. NeuroQuant volumetric analysis of the brain, see details on
[HOSPITAL] PACS.

## 2019-10-17 ENCOUNTER — Other Ambulatory Visit: Payer: Self-pay | Admitting: Urology

## 2019-11-07 ENCOUNTER — Other Ambulatory Visit: Payer: Self-pay | Admitting: Physician Assistant

## 2019-11-07 DIAGNOSIS — G8929 Other chronic pain: Secondary | ICD-10-CM

## 2019-11-07 DIAGNOSIS — M1711 Unilateral primary osteoarthritis, right knee: Secondary | ICD-10-CM

## 2019-11-07 DIAGNOSIS — M25561 Pain in right knee: Secondary | ICD-10-CM

## 2019-11-17 ENCOUNTER — Other Ambulatory Visit: Payer: Self-pay

## 2019-11-17 ENCOUNTER — Ambulatory Visit
Admission: RE | Admit: 2019-11-17 | Discharge: 2019-11-17 | Disposition: A | Discharge status: Home / Self Care | Payer: Medicare HMO | Source: Ambulatory Visit | Attending: Physician Assistant | Admitting: Physician Assistant

## 2019-11-17 DIAGNOSIS — M1711 Unilateral primary osteoarthritis, right knee: Secondary | ICD-10-CM | POA: Diagnosis not present

## 2019-11-17 DIAGNOSIS — G8929 Other chronic pain: Secondary | ICD-10-CM | POA: Insufficient documentation

## 2019-11-17 DIAGNOSIS — M25561 Pain in right knee: Secondary | ICD-10-CM | POA: Insufficient documentation

## 2020-03-26 NOTE — Discharge Instructions (Signed)
Instructions after Total Knee Replacement   Brian Howell P. Takoda Janowiak, [name], M.D.     Dept. of Orthopaedics & Sports Medicine  [facility]  [address]  [place], [place]  [address]  Phone: [phone]   Fax: [phone]    DIET: Drink plenty of non-alcoholic fluids. Resume your normal diet. Include foods high in fiber.  ACTIVITY:  You may use crutches or a walker with weight-bearing as tolerated, unless instructed otherwise. You may be weaned off of the walker or crutches by your Physical Therapist.  Do NOT place pillows under the knee. Anything placed under the knee could limit your ability to straighten the knee.   Continue doing gentle exercises. Exercising will reduce the pain and swelling, increase motion, and prevent muscle weakness.   Please continue to use the TED compression stockings for 6 weeks. You may remove the stockings at night, but should reapply them in the morning. Do not drive or operate any equipment until instructed.  WOUND CARE:  Continue to use the PolarCare or ice packs periodically to reduce pain and swelling. You may bathe or shower after the staples are removed at the first office visit following surgery.  MEDICATIONS: You may resume your regular medications. Please take the pain medication as prescribed on the medication. Do not take pain medication on an empty stomach. You have been given a prescription for a blood thinner (Lovenox or Coumadin). Please take the medication as instructed. (NOTE: After completing a 2 week course of Lovenox, take one Enteric-coated aspirin once a day. This along with elevation will help reduce the possibility of phlebitis in your operated leg.) Do not drive or drink alcoholic beverages when taking pain medications.  CALL THE OFFICE FOR: Temperature above 101 degrees Excessive bleeding or drainage on the dressing. Excessive swelling, coldness, or paleness of the toes. Persistent nausea and vomiting.  FOLLOW-UP:  You  should have an appointment to return to the office in 10-14 days after surgery. Arrangements have been made for continuation of Physical Therapy (either home therapy or outpatient therapy).   [facility] Department Directory         [email or web address]       [email or web address]          Cardiology  Appointments: [place] - [phone] [place] - [phone]  Endocrinology  Appointments: [place] - [phone] [place] - [phone]  Gastroenterology  Appointments: [place] - [phone] [place] - [phone]        General Surgery   Appointments: [place] - [phone]  Internal Medicine/Family Medicine  Appointments: [place] - [phone] [place] - [phone] [place] - [phone]  Metabolic and Weigh Loss Surgery  Appointments: [place] - [phone]        Neurology  Appointments: [place] - [phone] [place] - [phone]  Neurosurgery  Appointments: [place] - [phone]  Obstetrics & Gynecology  Appointments: [place] - [phone] [place] - [phone]        Pediatrics  Appointments: [place] - [phone] [place] - [phone]  Physiatry  Appointments: [place] -[phone]  Physical Therapy  Appointments: [place] - [phone] [place] - [phone]        Podiatry  Appointments: [place] - [phone] [place] - [phone]  Pulmonology  Appointments: [place] - [phone]  Rheumatology  Appointments: [place] - [phone]         Location: [facility]  [address] , [place]  [address]  [place] Location: [facility] [address] [place], [place]  [address]  [place] Location: [facility] [address] [place], [place]  [address]    

## 2020-04-01 ENCOUNTER — Other Ambulatory Visit: Admission: RE | Admit: 2020-04-01 | Payer: Medicare Other | Source: Ambulatory Visit

## 2020-04-03 ENCOUNTER — Inpatient Hospital Stay: Admission: RE | Admit: 2020-04-03 | Payer: Medicare Other | Source: Home / Self Care | Admitting: Orthopedic Surgery

## 2020-04-03 ENCOUNTER — Encounter: Admission: RE | Payer: Self-pay | Source: Home / Self Care

## 2020-04-03 SURGERY — ARTHROPLASTY, KNEE, TOTAL, USING IMAGELESS COMPUTER-ASSISTED NAVIGATION
Anesthesia: Choice | Site: Knee | Laterality: Right

## 2020-05-24 ENCOUNTER — Encounter: Admission: RE | Payer: Self-pay | Source: Home / Self Care

## 2020-05-24 ENCOUNTER — Inpatient Hospital Stay: Admission: RE | Admit: 2020-05-24 | Payer: Medicare Other | Source: Home / Self Care | Admitting: Orthopedic Surgery

## 2020-05-24 SURGERY — ARTHROPLASTY, KNEE, TOTAL, USING IMAGELESS COMPUTER-ASSISTED NAVIGATION
Anesthesia: Choice | Site: Knee | Laterality: Right

## 2020-06-07 ENCOUNTER — Other Ambulatory Visit: Payer: Self-pay

## 2020-06-07 ENCOUNTER — Other Ambulatory Visit: Payer: Self-pay | Admitting: Physician Assistant

## 2020-06-07 ENCOUNTER — Ambulatory Visit
Admission: RE | Admit: 2020-06-07 | Discharge: 2020-06-07 | Disposition: A | Discharge status: Home / Self Care | Payer: Medicare Other | Source: Ambulatory Visit | Attending: Physician Assistant | Admitting: Physician Assistant

## 2020-06-07 DIAGNOSIS — S0990XA Unspecified injury of head, initial encounter: Secondary | ICD-10-CM

## 2020-06-09 NOTE — Discharge Instructions (Signed)
Instructions after Total Knee Replacement   [name] [name], [name], M.D.     Dept. of Orthopaedics & Sports Medicine  [facility]  [address]  [place], [place]  [address]  Phone: [phone]   Fax: [phone]    DIET: Drink plenty of non-alcoholic fluids. Resume your normal diet. Include foods high in fiber.  ACTIVITY:  You may use crutches or a walker with weight-bearing as tolerated, unless instructed otherwise. You may be weaned off of the walker or crutches by your Physical Therapist.  Do NOT place pillows under the knee. Anything placed under the knee could limit your ability to straighten the knee.   Continue doing gentle exercises. Exercising will reduce the pain and swelling, increase motion, and prevent muscle weakness.   Please continue to use the TED compression stockings for 6 weeks. You may remove the stockings at night, but should reapply them in the morning. Do not drive or operate any equipment until instructed.  WOUND CARE:  Continue to use the PolarCare or ice packs periodically to reduce pain and swelling. You may bathe or shower after the staples are removed at the first office visit following surgery.  MEDICATIONS: You may resume your regular medications. Please take the pain medication as prescribed on the medication. Do not take pain medication on an empty stomach. You have been given a prescription for a blood thinner (Lovenox or Coumadin). Please take the medication as instructed. (NOTE: After completing a 2 week course of Lovenox, take one Enteric-coated aspirin once a day. This along with elevation will help reduce the possibility of phlebitis in your operated leg.) Do not drive or drink alcoholic beverages when taking pain medications.  CALL THE OFFICE FOR: Temperature above 101 degrees Excessive bleeding or drainage on the dressing. Excessive swelling, coldness, or paleness of the toes. Persistent nausea and vomiting.  FOLLOW-UP:  You  should have an appointment to return to the office in 10-14 days after surgery. Arrangements have been made for continuation of Physical Therapy (either home therapy or outpatient therapy).   [facility] Department Directory         [email or web address]       [email or web address]          Cardiology  Appointments: [place] - [phone] [place] - [phone]  Endocrinology  Appointments: [place] - [phone] [place] - [phone]  Gastroenterology  Appointments: [place] - [phone] [place] - [phone]        General Surgery   Appointments: [place] - [phone]  Internal Medicine/Family Medicine  Appointments: [place] - [phone] [place] - [phone] [place] - [phone]  Metabolic and Weigh Loss Surgery  Appointments: [place] - [phone]        Neurology  Appointments: [place] - [phone] [place] - [phone]  Neurosurgery  Appointments: [place] - [phone]  Obstetrics & Gynecology  Appointments: [place] - [phone] [place] - [phone]        Pediatrics  Appointments: [place] - [phone] [place] - [phone]  Physiatry  Appointments: [place] -[phone]  Physical Therapy  Appointments: [place] - [phone] [place] - [phone]        Podiatry  Appointments: [place] - [phone] [place] - [phone]  Pulmonology  Appointments: [place] - [phone]  Rheumatology  Appointments: [place] - [phone]        [place] Location: [facility]  [address] [place], [place]  [address]  [place] Location: [facility] [address] [place], [place]  [address]  [place] Location: [facility] [address] [place],   [address]    

## 2020-06-13 ENCOUNTER — Other Ambulatory Visit: Payer: Self-pay

## 2020-06-13 ENCOUNTER — Encounter
Admission: RE | Admit: 2020-06-13 | Discharge: 2020-06-13 | Disposition: A | Discharge status: Home / Self Care | Payer: Medicare Other | Source: Ambulatory Visit | Attending: Orthopedic Surgery | Admitting: Orthopedic Surgery

## 2020-06-13 DIAGNOSIS — Z01812 Encounter for preprocedural laboratory examination: Secondary | ICD-10-CM | POA: Insufficient documentation

## 2020-06-13 DIAGNOSIS — Z20822 Contact with and (suspected) exposure to covid-19: Secondary | ICD-10-CM | POA: Diagnosis not present

## 2020-06-13 HISTORY — DX: Sleep apnea, unspecified: G47.30

## 2020-06-13 HISTORY — DX: Other amnesia: R41.3

## 2020-06-13 HISTORY — DX: Unspecified osteoarthritis, unspecified site: M19.90

## 2020-06-13 LAB — URINALYSIS, ROUTINE W REFLEX MICROSCOPIC
Bilirubin Urine: NEGATIVE
Glucose, UA: NEGATIVE mg/dL
Hgb urine dipstick: NEGATIVE
Ketones, ur: NEGATIVE mg/dL
Leukocytes,Ua: NEGATIVE
Nitrite: NEGATIVE
Protein, ur: NEGATIVE mg/dL
Specific Gravity, Urine: 1.017 (ref 1.005–1.030)
pH: 5 (ref 5.0–8.0)

## 2020-06-13 LAB — COMPREHENSIVE METABOLIC PANEL
ALT: 23 U/L (ref 0–44)
AST: 21 U/L (ref 15–41)
Albumin: 4 g/dL (ref 3.5–5.0)
Alkaline Phosphatase: 71 U/L (ref 38–126)
Anion gap: 9 (ref 5–15)
BUN: 12 mg/dL (ref 6–20)
CO2: 28 mmol/L (ref 22–32)
Calcium: 9.4 mg/dL (ref 8.9–10.3)
Chloride: 100 mmol/L (ref 98–111)
Creatinine, Ser: 0.66 mg/dL (ref 0.61–1.24)
GFR, Estimated: >60 mL/min (ref >60)
Glucose, Bld: 130 mg/dL — ABNORMAL HIGH (ref 70–99)
Potassium: 3.7 mmol/L (ref 3.5–5.1)
Sodium: 137 mmol/L (ref 135–145)
Total Bilirubin: 0.9 mg/dL (ref 0.3–1.2)
Total Protein: 7 g/dL (ref 6.5–8.1)

## 2020-06-13 LAB — CBC
HCT: 39.6 % (ref 39.0–52.0)
Hemoglobin: 13 g/dL (ref 13.0–17.0)
MCH: 27.6 pg (ref 26.0–34.0)
MCHC: 32.8 g/dL (ref 30.0–36.0)
MCV: 84.1 fL (ref 80.0–100.0)
Platelets: 196 10*3/uL (ref 150–400)
RBC: 4.71 MIL/uL (ref 4.22–5.81)
RDW: 13.9 % (ref 11.5–15.5)
WBC: 7.6 10*3/uL (ref 4.0–10.5)
nRBC: 0 % (ref 0.0–0.2)

## 2020-06-13 LAB — C-REACTIVE PROTEIN: CRP: 0.7 mg/dL (ref <1.0)

## 2020-06-13 LAB — APTT: aPTT: 32 seconds (ref 24–36)

## 2020-06-13 LAB — TYPE AND SCREEN
ABO/RH(D): A POS
Antibody Screen: NEGATIVE

## 2020-06-13 LAB — PROTIME-INR
INR: 1 (ref 0.8–1.2)
Prothrombin Time: 12.8 seconds (ref 11.4–15.2)

## 2020-06-13 LAB — SURGICAL PCR SCREEN
MRSA, PCR: NEGATIVE
Staphylococcus aureus: NEGATIVE

## 2020-06-13 LAB — SEDIMENTATION RATE: Sed Rate: 14 mm/hr (ref 0–20)

## 2020-06-13 LAB — SARS CORONAVIRUS 2 (TAT 6-24 HRS): SARS Coronavirus 2: NEGATIVE

## 2020-06-13 NOTE — Patient Instructions (Signed)
Your procedure is scheduled on: Mon 10/25 Report to Day Surgery. To find out your arrival time please call [phone] between 1PM - 3PM on Friday 10/22.  Remember: Instructions that are not followed completely may result in serious medical risk,  up to and including death, or upon the discretion of your surgeon and anesthesiologist your  surgery may need to be rescheduled.     _X__ 1. Do not eat food after midnight the night before your procedure.                 No chewing gum or hard candies. You may drink clear liquids up to 2 hours                 before you are scheduled to arrive for your surgery- DO not drink clear                 liquids within 2 hours of the start of your surgery.                 Clear Liquids include:  water,clear Gatorade, G2 or                  Gatorade Zero (avoid Red/Purple/Blue), Black Coffee or Tea (Do not add                 anything to coffee or tea). _____2.   Complete the**If you       are diabetic you will be provided with an alternative drink, Gatorade Zero or G2. Complete 2 hours before you arrive  __X__2.  On the morning of surgery brush your teeth with toothpaste and water, you                may rinse your mouth with mouthwash if you wish.  Do not swallow any toothpaste of mouthwash.     ___ 3.  No Alcohol for 24 hours before or after surgery.   ___ 4.  Do Not Smoke or use e-cigarettes For 24 Hours Prior to Your Surgery.                 Do not use any chewable tobacco products for at least 6 hours prior to                 Surgery.  ___  5.  Do not use any recreational drugs (marijuana, cocaine, heroin, ecstasy, MDMA or other)                For at least one week prior to your surgery.  Combination of these drugs with anesthesia                May have life threatening results.  ____  6.  Bring all medications with you on the day of surgery if instructed.   _x___  7.  Notify your doctor if there is any change in your  medical condition      (cold, fever, infections).     Do not wear jewelry, Do not wear lotions, You may wear deodorant. Do not shave 48 hours prior to surgery. Men may shave face and neck. Do not bring valuables to the hospital.    [facility] is not responsible for any belongings or valuables.  Contacts, dentures or bridgework may not be worn into surgery. Leave your suitcase in the car. After surgery it may be brought to your room. For patients admitted to the hospital, discharge time is  determined by your treatment team.   Patients discharged the day of surgery will not be allowed to drive home.   Make arrangements for someone to be with you for the first 24 hours of your Same Day Discharge.    Please read over the following fact sheets that you were given:    __x__ Take these medicines the morning of surgery with A SIP OF WATER:    1. acetaminophen (TYLENOL) 500 MG tablet or Oxycodone HCl 10 MG TABS if needed  2. DULoxetine (CYMBALTA) 60 MG capsule  3. hydrOXYzine (ATARAX/VISTARIL) 25 MG tablet  4.  5.  6.  ____ Fleet Enema (as directed)   _x___ Use CHG Soap (or wipes) as directed  ____ Use Benzoyl Peroxide Gel as instructed  ____ Use inhalers on the day of surgery  __x__ Stop metformin 2 days prior to surgery Last dose on 10/22   _x___ Take 1/2 of usual insulin dose the night before surgery. No insulin the morning          of surgery.   __x__ Stopped  aspirin on 10/14  __x__ Stopped Anti-inflammatories ibuprofen on 10/14   __x__ Stopped supplements until after surgery.  Melatonin already  _x___ Bring C-Pap to the hospital.    If you have any questions regarding your pre-procedure instructions,  Please call Pre-admit Testing at [place]

## 2020-06-15 LAB — URINE CULTURE
Culture: NO GROWTH
Special Requests: NORMAL

## 2020-06-17 ENCOUNTER — Encounter: Payer: Self-pay | Admitting: Orthopedic Surgery

## 2020-06-17 ENCOUNTER — Inpatient Hospital Stay: Payer: Medicare Other | Admitting: Anesthesiology

## 2020-06-17 ENCOUNTER — Inpatient Hospital Stay: Payer: Medicare Other

## 2020-06-17 ENCOUNTER — Encounter
Admission: RE | Disposition: A | Discharge status: Home Health | Payer: Self-pay | Source: Home / Self Care | Attending: Orthopedic Surgery

## 2020-06-17 ENCOUNTER — Inpatient Hospital Stay
Admission: RE | Admit: 2020-06-17 | Discharge: 2020-06-19 | DRG: 470 | Disposition: A | Discharge status: Home Health | Payer: Medicare Other | Attending: Orthopedic Surgery | Admitting: Orthopedic Surgery

## 2020-06-17 ENCOUNTER — Other Ambulatory Visit: Payer: Self-pay

## 2020-06-17 DIAGNOSIS — Z6839 Body mass index (BMI) 39.0-39.9, adult: Secondary | ICD-10-CM | POA: Diagnosis not present

## 2020-06-17 DIAGNOSIS — Z794 Long term (current) use of insulin: Secondary | ICD-10-CM

## 2020-06-17 DIAGNOSIS — E669 Obesity, unspecified: Secondary | ICD-10-CM | POA: Diagnosis present

## 2020-06-17 DIAGNOSIS — G43909 Migraine, unspecified, not intractable, without status migrainosus: Secondary | ICD-10-CM | POA: Diagnosis present

## 2020-06-17 DIAGNOSIS — F449 Dissociative and conversion disorder, unspecified: Secondary | ICD-10-CM | POA: Diagnosis present

## 2020-06-17 DIAGNOSIS — G4733 Obstructive sleep apnea (adult) (pediatric): Secondary | ICD-10-CM | POA: Diagnosis present

## 2020-06-17 DIAGNOSIS — E1140 Type 2 diabetes mellitus with diabetic neuropathy, unspecified: Secondary | ICD-10-CM | POA: Diagnosis present

## 2020-06-17 DIAGNOSIS — Z833 Family history of diabetes mellitus: Secondary | ICD-10-CM | POA: Diagnosis not present

## 2020-06-17 DIAGNOSIS — Z9049 Acquired absence of other specified parts of digestive tract: Secondary | ICD-10-CM

## 2020-06-17 DIAGNOSIS — J45909 Unspecified asthma, uncomplicated: Secondary | ICD-10-CM | POA: Diagnosis present

## 2020-06-17 DIAGNOSIS — Z96659 Presence of unspecified artificial knee joint: Secondary | ICD-10-CM

## 2020-06-17 DIAGNOSIS — M1711 Unilateral primary osteoarthritis, right knee: Principal | ICD-10-CM | POA: Diagnosis present

## 2020-06-17 DIAGNOSIS — F419 Anxiety disorder, unspecified: Secondary | ICD-10-CM | POA: Diagnosis present

## 2020-06-17 DIAGNOSIS — I252 Old myocardial infarction: Secondary | ICD-10-CM

## 2020-06-17 DIAGNOSIS — F32A Depression, unspecified: Secondary | ICD-10-CM | POA: Diagnosis present

## 2020-06-17 DIAGNOSIS — Z9119 Patient's noncompliance with other medical treatment and regimen: Secondary | ICD-10-CM | POA: Diagnosis not present

## 2020-06-17 DIAGNOSIS — Z8249 Family history of ischemic heart disease and other diseases of the circulatory system: Secondary | ICD-10-CM

## 2020-06-17 DIAGNOSIS — Z888 Allergy status to other drugs, medicaments and biological substances status: Secondary | ICD-10-CM | POA: Diagnosis not present

## 2020-06-17 DIAGNOSIS — Z79899 Other long term (current) drug therapy: Secondary | ICD-10-CM | POA: Diagnosis not present

## 2020-06-17 DIAGNOSIS — I10 Essential (primary) hypertension: Secondary | ICD-10-CM | POA: Diagnosis present

## 2020-06-17 DIAGNOSIS — E785 Hyperlipidemia, unspecified: Secondary | ICD-10-CM | POA: Diagnosis present

## 2020-06-17 HISTORY — PX: KNEE ARTHROPLASTY: SHX992

## 2020-06-17 LAB — GLUCOSE, CAPILLARY
Glucose-Capillary: 183 mg/dL — ABNORMAL HIGH (ref 70–99)
Glucose-Capillary: 241 mg/dL — ABNORMAL HIGH (ref 70–99)
Glucose-Capillary: 285 mg/dL — ABNORMAL HIGH (ref 70–99)
Glucose-Capillary: 404 mg/dL — ABNORMAL HIGH (ref 70–99)

## 2020-06-17 LAB — ABO/RH: ABO/RH(D): A POS

## 2020-06-17 SURGERY — ARTHROPLASTY, KNEE, TOTAL, USING IMAGELESS COMPUTER-ASSISTED NAVIGATION
Anesthesia: Spinal | Site: Knee | Laterality: Right

## 2020-06-17 MED ORDER — FUROSEMIDE 20 MG PO TABS
20.0000 mg | ORAL_TABLET | Freq: Every day | ORAL | Status: DC | PRN
  Filled 2020-06-17: qty 1

## 2020-06-17 MED ORDER — SIMVASTATIN 20 MG PO TABS
20.0000 mg | ORAL_TABLET | Freq: Every day | ORAL | Status: DC
  Administered 2020-06-17 – 2020-06-18 (×2): 20 mg via ORAL
  Filled 2020-06-17 (×3): qty 1

## 2020-06-17 MED ORDER — METOCLOPRAMIDE HCL 10 MG PO TABS
10.0000 mg | ORAL_TABLET | Freq: Three times a day (TID) | ORAL | Status: DC
  Administered 2020-06-17 – 2020-06-19 (×6): 10 mg via ORAL
  Filled 2020-06-17 (×8): qty 1

## 2020-06-17 MED ORDER — CEFAZOLIN SODIUM-DEXTROSE 2-4 GM/100ML-% IV SOLN
INTRAVENOUS | Status: AC
  Filled 2020-06-17: qty 100

## 2020-06-17 MED ORDER — INSULIN ASPART 100 UNIT/ML ~~LOC~~ SOLN: 55.0000 [IU] | Freq: Two times a day (BID) | SUBCUTANEOUS | Status: DC

## 2020-06-17 MED ORDER — INSULIN ASPART 100 UNIT/ML ~~LOC~~ SOLN
SUBCUTANEOUS | Status: AC
  Filled 2020-06-17: qty 1

## 2020-06-17 MED ORDER — TRANEXAMIC ACID-NACL 1000-0.7 MG/100ML-% IV SOLN
INTRAVENOUS | Status: AC
  Filled 2020-06-17: qty 100

## 2020-06-17 MED ORDER — HYDROXYZINE HCL 25 MG PO TABS
25.0000 mg | ORAL_TABLET | Freq: Three times a day (TID) | ORAL | Status: DC
  Administered 2020-06-17 – 2020-06-19 (×5): 25 mg via ORAL
  Filled 2020-06-17 (×8): qty 1

## 2020-06-17 MED ORDER — CEFAZOLIN SODIUM-DEXTROSE 2-4 GM/100ML-% IV SOLN
2.0000 g | Freq: Four times a day (QID) | INTRAVENOUS | Status: AC
  Administered 2020-06-17: 2 g via INTRAVENOUS
  Filled 2020-06-17: qty 100

## 2020-06-17 MED ORDER — ACETAMINOPHEN 325 MG PO TABS: 325.0000 mg | ORAL_TABLET | Freq: Four times a day (QID) | ORAL | Status: DC | PRN

## 2020-06-17 MED ORDER — SODIUM CHLORIDE 0.9 % IV SOLN: INTRAVENOUS | Status: DC

## 2020-06-17 MED ORDER — SURGIPHOR WOUND IRRIGATION SYSTEM - OPTIME
TOPICAL | Status: DC | PRN
  Administered 2020-06-17: 450 mL via TOPICAL

## 2020-06-17 MED ORDER — FLEET ENEMA 7-19 GM/118ML RE ENEM: 1.0000 | ENEMA | Freq: Once | RECTAL | Status: DC | PRN

## 2020-06-17 MED ORDER — PROPOFOL 10 MG/ML IV BOLUS
INTRAVENOUS | Status: DC | PRN
  Administered 2020-06-17 (×3): 30 mg via INTRAVENOUS
  Administered 2020-06-17: 20 mg via INTRAVENOUS
  Administered 2020-06-17: 30 mg via INTRAVENOUS

## 2020-06-17 MED ORDER — LIDOCAINE HCL (CARDIAC) PF 100 MG/5ML IV SOSY
PREFILLED_SYRINGE | INTRAVENOUS | Status: DC | PRN
  Administered 2020-06-17: 100 mg via INTRAVENOUS

## 2020-06-17 MED ORDER — ORAL CARE MOUTH RINSE: 15.0000 mL | Freq: Once | OROMUCOSAL | Status: DC

## 2020-06-17 MED ORDER — DEXAMETHASONE SODIUM PHOSPHATE 10 MG/ML IJ SOLN: 8.0000 mg | Freq: Once | INTRAMUSCULAR | Status: DC

## 2020-06-17 MED ORDER — CHLORHEXIDINE GLUCONATE 4 % EX LIQD: 60.0000 mL | Freq: Once | CUTANEOUS | Status: DC

## 2020-06-17 MED ORDER — OXYCODONE HCL 5 MG PO TABS
5.0000 mg | ORAL_TABLET | ORAL | Status: DC | PRN
  Administered 2020-06-18: 10 mg via ORAL
  Administered 2020-06-18: 5 mg via ORAL
  Administered 2020-06-19: 10 mg via ORAL
  Filled 2020-06-17 (×3): qty 2

## 2020-06-17 MED ORDER — PHENOL 1.4 % MT LIQD
1.0000 | OROMUCOSAL | Status: DC | PRN
  Filled 2020-06-17: qty 177

## 2020-06-17 MED ORDER — INSULIN DETEMIR 100 UNIT/ML ~~LOC~~ SOLN
40.0000 [IU] | Freq: Two times a day (BID) | SUBCUTANEOUS | Status: DC
  Administered 2020-06-17 – 2020-06-19 (×4): 40 [IU] via SUBCUTANEOUS
  Filled 2020-06-17 (×5): qty 0.4

## 2020-06-17 MED ORDER — CLONAZEPAM 0.5 MG PO TABS
0.5000 mg | ORAL_TABLET | Freq: Every day | ORAL | Status: DC
  Administered 2020-06-17 – 2020-06-18 (×2): 0.5 mg via ORAL
  Filled 2020-06-17 (×2): qty 1

## 2020-06-17 MED ORDER — OXYCODONE HCL 5 MG PO TABS: 5.0000 mg | ORAL_TABLET | Freq: Once | ORAL | Status: DC | PRN

## 2020-06-17 MED ORDER — INSULIN NPH (HUMAN) (ISOPHANE) 100 UNIT/ML ~~LOC~~ SUSP: 35.0000 [IU] | SUBCUTANEOUS | Status: DC

## 2020-06-17 MED ORDER — NEOMYCIN-POLYMYXIN B GU 40-200000 IR SOLN
Status: DC | PRN
  Administered 2020-06-17: 16 mL

## 2020-06-17 MED ORDER — ENSURE PRE-SURGERY PO LIQD
296.0000 mL | Freq: Once | ORAL | Status: DC
  Filled 2020-06-17: qty 296

## 2020-06-17 MED ORDER — ACETAMINOPHEN 10 MG/ML IV SOLN
1000.0000 mg | Freq: Four times a day (QID) | INTRAVENOUS | Status: AC
  Administered 2020-06-17 – 2020-06-18 (×3): 1000 mg via INTRAVENOUS
  Filled 2020-06-17 (×3): qty 100

## 2020-06-17 MED ORDER — SODIUM CHLORIDE 0.9 % IV SOLN
INTRAVENOUS | Status: DC | PRN
  Administered 2020-06-17: 60 mL

## 2020-06-17 MED ORDER — BUPIVACAINE HCL (PF) 0.5 % IJ SOLN
INTRAMUSCULAR | Status: DC | PRN
  Administered 2020-06-17: 3 mL via INTRATHECAL

## 2020-06-17 MED ORDER — INSULIN ASPART 100 UNIT/ML ~~LOC~~ SOLN
28.0000 [IU] | Freq: Every day | SUBCUTANEOUS | Status: DC
  Administered 2020-06-18: 28 [IU] via SUBCUTANEOUS
  Filled 2020-06-17: qty 1

## 2020-06-17 MED ORDER — CHLORHEXIDINE GLUCONATE 0.12 % MT SOLN: 15.0000 mL | Freq: Once | OROMUCOSAL | Status: DC

## 2020-06-17 MED ORDER — TRAZODONE HCL 100 MG PO TABS
100.0000 mg | ORAL_TABLET | Freq: Every day | ORAL | Status: DC
  Administered 2020-06-17 – 2020-06-18 (×2): 100 mg via ORAL
  Filled 2020-06-17 (×3): qty 1

## 2020-06-17 MED ORDER — ACETAMINOPHEN 10 MG/ML IV SOLN
INTRAVENOUS | Status: AC
  Filled 2020-06-17: qty 100

## 2020-06-17 MED ORDER — TIZANIDINE HCL 4 MG PO TABS
4.0000 mg | ORAL_TABLET | Freq: Three times a day (TID) | ORAL | Status: DC | PRN
  Filled 2020-06-17: qty 1

## 2020-06-17 MED ORDER — OXYCODONE HCL 5 MG PO TABS
10.0000 mg | ORAL_TABLET | Freq: Three times a day (TID) | ORAL | Status: DC
  Administered 2020-06-17 – 2020-06-19 (×5): 10 mg via ORAL
  Filled 2020-06-17: qty 1
  Filled 2020-06-17 (×2): qty 2
  Filled 2020-06-17 (×4): qty 1
  Filled 2020-06-17: qty 2
  Filled 2020-06-17: qty 1
  Filled 2020-06-17 (×2): qty 2

## 2020-06-17 MED ORDER — INSULIN REGULAR HUMAN 100 UNIT/ML IJ SOLN: 35.0000 [IU] | INTRAMUSCULAR | Status: DC

## 2020-06-17 MED ORDER — DIPHENHYDRAMINE HCL 12.5 MG/5ML PO ELIX
12.5000 mg | ORAL_SOLUTION | ORAL | Status: DC | PRN
  Filled 2020-06-17: qty 10

## 2020-06-17 MED ORDER — SODIUM CHLORIDE 0.9 % IV SOLN: INTRAVENOUS | Status: DC | PRN

## 2020-06-17 MED ORDER — PANTOPRAZOLE SODIUM 40 MG PO TBEC
40.0000 mg | DELAYED_RELEASE_TABLET | Freq: Two times a day (BID) | ORAL | Status: DC
  Administered 2020-06-17 – 2020-06-19 (×4): 40 mg via ORAL
  Filled 2020-06-17 (×5): qty 1

## 2020-06-17 MED ORDER — OXYCODONE HCL 5 MG/5ML PO SOLN: 5.0000 mg | Freq: Once | ORAL | Status: DC | PRN

## 2020-06-17 MED ORDER — CHLORHEXIDINE GLUCONATE 0.12 % MT SOLN
OROMUCOSAL | Status: AC
  Administered 2020-06-17: 15 mL via OROMUCOSAL
  Filled 2020-06-17: qty 15

## 2020-06-17 MED ORDER — ONDANSETRON HCL 4 MG PO TABS: 4.0000 mg | ORAL_TABLET | Freq: Four times a day (QID) | ORAL | Status: DC | PRN

## 2020-06-17 MED ORDER — INSULIN ASPART 100 UNIT/ML ~~LOC~~ SOLN
18.0000 [IU] | Freq: Once | SUBCUTANEOUS | Status: AC
  Administered 2020-06-17: 18 [IU] via SUBCUTANEOUS
  Filled 2020-06-17: qty 1

## 2020-06-17 MED ORDER — TRANEXAMIC ACID-NACL 1000-0.7 MG/100ML-% IV SOLN
INTRAVENOUS | Status: DC | PRN
  Administered 2020-06-17: 1000 mg via INTRAVENOUS

## 2020-06-17 MED ORDER — DULOXETINE HCL 60 MG PO CPEP
60.0000 mg | ORAL_CAPSULE | Freq: Two times a day (BID) | ORAL | Status: DC
  Administered 2020-06-17 – 2020-06-19 (×4): 60 mg via ORAL
  Filled 2020-06-17 (×6): qty 1

## 2020-06-17 MED ORDER — CEFAZOLIN SODIUM-DEXTROSE 2-4 GM/100ML-% IV SOLN: 2.0000 g | INTRAVENOUS | Status: DC

## 2020-06-17 MED ORDER — FENTANYL CITRATE (PF) 100 MCG/2ML IJ SOLN
INTRAMUSCULAR | Status: AC
  Filled 2020-06-17: qty 2

## 2020-06-17 MED ORDER — CELECOXIB 200 MG PO CAPS
200.0000 mg | ORAL_CAPSULE | Freq: Two times a day (BID) | ORAL | Status: DC
  Administered 2020-06-17 – 2020-06-19 (×4): 200 mg via ORAL
  Filled 2020-06-17 (×4): qty 1

## 2020-06-17 MED ORDER — INSULIN ASPART 100 UNIT/ML ~~LOC~~ SOLN
44.0000 [IU] | Freq: Two times a day (BID) | SUBCUTANEOUS | Status: DC
  Administered 2020-06-18 – 2020-06-19 (×3): 44 [IU] via SUBCUTANEOUS
  Filled 2020-06-17 (×3): qty 1

## 2020-06-17 MED ORDER — DEXAMETHASONE SODIUM PHOSPHATE 10 MG/ML IJ SOLN
INTRAMUSCULAR | Status: AC
  Administered 2020-06-17: 8 mg via INTRAVENOUS
  Filled 2020-06-17: qty 1

## 2020-06-17 MED ORDER — PROPOFOL 500 MG/50ML IV EMUL
INTRAVENOUS | Status: AC
  Filled 2020-06-17: qty 50

## 2020-06-17 MED ORDER — ONDANSETRON HCL 4 MG/2ML IJ SOLN: 4.0000 mg | Freq: Four times a day (QID) | INTRAMUSCULAR | Status: DC | PRN

## 2020-06-17 MED ORDER — CELECOXIB 200 MG PO CAPS: 400.0000 mg | ORAL_CAPSULE | Freq: Once | ORAL | Status: DC

## 2020-06-17 MED ORDER — PROPOFOL 500 MG/50ML IV EMUL
INTRAVENOUS | Status: DC | PRN
  Administered 2020-06-17: 80 ug/kg/min via INTRAVENOUS

## 2020-06-17 MED ORDER — POLYVINYL ALCOHOL 1.4 % OP SOLN
1.0000 [drp] | Freq: Four times a day (QID) | OPHTHALMIC | Status: DC | PRN
  Filled 2020-06-17: qty 15

## 2020-06-17 MED ORDER — ALUM & MAG HYDROXIDE-SIMETH 200-200-20 MG/5ML PO SUSP: 30.0000 mL | ORAL | Status: DC | PRN

## 2020-06-17 MED ORDER — LOSARTAN POTASSIUM 25 MG PO TABS
25.0000 mg | ORAL_TABLET | Freq: Every day | ORAL | Status: DC
  Administered 2020-06-18 – 2020-06-19 (×2): 25 mg via ORAL
  Filled 2020-06-17 (×2): qty 1

## 2020-06-17 MED ORDER — HYDROMORPHONE HCL 1 MG/ML IJ SOLN
0.5000 mg | INTRAMUSCULAR | Status: DC | PRN
  Administered 2020-06-17: 0.5 mg via INTRAVENOUS
  Filled 2020-06-17: qty 1

## 2020-06-17 MED ORDER — ONDANSETRON HCL 4 MG/2ML IJ SOLN
4.0000 mg | Freq: Once | INTRAMUSCULAR | Status: AC | PRN
  Administered 2020-06-17: 4 mg via INTRAVENOUS

## 2020-06-17 MED ORDER — ONDANSETRON HCL 4 MG/2ML IJ SOLN
INTRAMUSCULAR | Status: DC | PRN
  Administered 2020-06-17: 4 mg via INTRAVENOUS

## 2020-06-17 MED ORDER — LIDOCAINE HCL (PF) 1 % IJ SOLN
INTRAMUSCULAR | Status: DC | PRN
  Administered 2020-06-17: 1 mL

## 2020-06-17 MED ORDER — METFORMIN HCL 500 MG PO TABS
1000.0000 mg | ORAL_TABLET | Freq: Two times a day (BID) | ORAL | Status: DC
  Administered 2020-06-18 – 2020-06-19 (×3): 1000 mg via ORAL
  Filled 2020-06-17 (×4): qty 2

## 2020-06-17 MED ORDER — TRANEXAMIC ACID-NACL 1000-0.7 MG/100ML-% IV SOLN: 1000.0000 mg | INTRAVENOUS | Status: DC

## 2020-06-17 MED ORDER — CELECOXIB 200 MG PO CAPS
ORAL_CAPSULE | ORAL | Status: AC
  Administered 2020-06-17: 400 mg via ORAL
  Filled 2020-06-17: qty 2

## 2020-06-17 MED ORDER — ENOXAPARIN SODIUM 30 MG/0.3ML ~~LOC~~ SOLN
30.0000 mg | Freq: Two times a day (BID) | SUBCUTANEOUS | Status: DC
  Administered 2020-06-18 – 2020-06-19 (×3): 30 mg via SUBCUTANEOUS
  Filled 2020-06-17 (×4): qty 0.3

## 2020-06-17 MED ORDER — MENTHOL 3 MG MT LOZG
1.0000 | LOZENGE | OROMUCOSAL | Status: DC | PRN
  Filled 2020-06-17: qty 9

## 2020-06-17 MED ORDER — INSULIN ASPART 100 UNIT/ML ~~LOC~~ SOLN: 0.0000 [IU] | Freq: Three times a day (TID) | SUBCUTANEOUS | Status: DC

## 2020-06-17 MED ORDER — ACETAMINOPHEN 10 MG/ML IV SOLN
INTRAVENOUS | Status: DC | PRN
  Administered 2020-06-17: 1000 mg via INTRAVENOUS

## 2020-06-17 MED ORDER — FAMOTIDINE 20 MG PO TABS
ORAL_TABLET | ORAL | Status: AC
  Administered 2020-06-17: 20 mg via ORAL
  Filled 2020-06-17: qty 1

## 2020-06-17 MED ORDER — METFORMIN HCL 500 MG PO TABS
1000.0000 mg | ORAL_TABLET | Freq: Once | ORAL | Status: AC
  Administered 2020-06-17: 1000 mg via ORAL
  Filled 2020-06-17: qty 2

## 2020-06-17 MED ORDER — MAGNESIUM HYDROXIDE 400 MG/5ML PO SUSP
30.0000 mL | Freq: Every day | ORAL | Status: DC
  Administered 2020-06-18 – 2020-06-19 (×2): 30 mL via ORAL
  Filled 2020-06-17 (×3): qty 30

## 2020-06-17 MED ORDER — LACTATED RINGERS IV SOLN: INTRAVENOUS | Status: DC | PRN

## 2020-06-17 MED ORDER — SENNOSIDES-DOCUSATE SODIUM 8.6-50 MG PO TABS
1.0000 | ORAL_TABLET | Freq: Two times a day (BID) | ORAL | Status: DC
  Administered 2020-06-17 – 2020-06-19 (×4): 1 via ORAL
  Filled 2020-06-17 (×5): qty 1

## 2020-06-17 MED ORDER — BUPIVACAINE HCL (PF) 0.25 % IJ SOLN
INTRAMUSCULAR | Status: DC | PRN
  Administered 2020-06-17: 60 mL

## 2020-06-17 MED ORDER — DEXMEDETOMIDINE (PRECEDEX) IN NS 20 MCG/5ML (4 MCG/ML) IV SYRINGE
PREFILLED_SYRINGE | INTRAVENOUS | Status: DC | PRN
  Administered 2020-06-17: 4 ug via INTRAVENOUS

## 2020-06-17 MED ORDER — ONDANSETRON HCL 4 MG/2ML IJ SOLN
INTRAMUSCULAR | Status: AC
  Filled 2020-06-17: qty 2

## 2020-06-17 MED ORDER — FENTANYL CITRATE (PF) 100 MCG/2ML IJ SOLN: 25.0000 ug | INTRAMUSCULAR | Status: DC | PRN

## 2020-06-17 MED ORDER — FAMOTIDINE 20 MG PO TABS: 20.0000 mg | ORAL_TABLET | Freq: Once | ORAL | Status: AC

## 2020-06-17 MED ORDER — INSULIN ASPART 100 UNIT/ML ~~LOC~~ SOLN
0.0000 [IU] | Freq: Three times a day (TID) | SUBCUTANEOUS | Status: DC
  Administered 2020-06-18: 11 [IU] via SUBCUTANEOUS
  Administered 2020-06-18: 5 [IU] via SUBCUTANEOUS
  Administered 2020-06-18: 11 [IU] via SUBCUTANEOUS
  Administered 2020-06-18: 8 [IU] via SUBCUTANEOUS
  Administered 2020-06-19: 2 [IU] via SUBCUTANEOUS
  Filled 2020-06-17 (×4): qty 1

## 2020-06-17 MED ORDER — TRANEXAMIC ACID-NACL 1000-0.7 MG/100ML-% IV SOLN
1000.0000 mg | Freq: Once | INTRAVENOUS | Status: AC
  Administered 2020-06-17: 1000 mg via INTRAVENOUS

## 2020-06-17 MED ORDER — MIDAZOLAM HCL 2 MG/2ML IJ SOLN
INTRAMUSCULAR | Status: AC
  Filled 2020-06-17: qty 2

## 2020-06-17 MED ORDER — DEXTROSE 5 % IV SOLN
INTRAVENOUS | Status: DC | PRN
  Administered 2020-06-17: 3 g via INTRAVENOUS

## 2020-06-17 MED ORDER — CARBOXYMETHYLCELLUL-GLYCERIN 0.5-0.9 % OP SOLN
1.0000 [drp] | Freq: Four times a day (QID) | OPHTHALMIC | Status: DC | PRN
  Filled 2020-06-17: qty 15

## 2020-06-17 MED ORDER — FERROUS SULFATE 325 (65 FE) MG PO TABS
325.0000 mg | ORAL_TABLET | Freq: Two times a day (BID) | ORAL | Status: DC
  Administered 2020-06-18 – 2020-06-19 (×3): 325 mg via ORAL
  Filled 2020-06-17 (×4): qty 1

## 2020-06-17 MED ORDER — HYPROMELLOSE (GONIOSCOPIC) 2.5 % OP SOLN
1.0000 [drp] | Freq: Four times a day (QID) | OPHTHALMIC | Status: DC | PRN
  Filled 2020-06-17: qty 15

## 2020-06-17 MED ORDER — BISACODYL 10 MG RE SUPP
10.0000 mg | Freq: Every day | RECTAL | Status: DC | PRN
  Filled 2020-06-17: qty 1

## 2020-06-17 MED ORDER — MIDAZOLAM HCL 5 MG/5ML IJ SOLN
INTRAMUSCULAR | Status: DC | PRN
  Administered 2020-06-17: 2 mg via INTRAVENOUS

## 2020-06-17 SURGICAL SUPPLY — 76 items
ATTUNE MED DOME PAT 41 KNEE (Knees) ×1 IMPLANT
ATTUNE PS FEM RT SZ 5 CEM KNEE (Femur) ×1 IMPLANT
ATTUNE PSRP INSR SZ5 5 KNEE (Insert) ×1 IMPLANT
BASE TIBIA ATTUNE KNEE SYS SZ6 (Knees) IMPLANT
BATTERY INSTRU NAVIGATION (MISCELLANEOUS) ×8 IMPLANT
BLADE SAW 70X12.5 (BLADE) ×2 IMPLANT
BLADE SAW 90X13X1.19 OSCILLAT (BLADE) ×2 IMPLANT
BLADE SAW 90X25X1.19 OSCILLAT (BLADE) ×2 IMPLANT
BONE CEMENT GENTAMICIN (Cement) ×4 IMPLANT
CANISTER PREVENA 45 (CANNISTER) ×2 IMPLANT
CANISTER SUCT 3000ML PPV (MISCELLANEOUS) ×2 IMPLANT
CEMENT BONE GENTAMICIN 40 (Cement) IMPLANT
COOLER POLAR GLACIER W/PUMP (MISCELLANEOUS) ×2 IMPLANT
COVER WAND RF STERILE (DRAPES) ×2 IMPLANT
CUFF TOURN SGL QUICK 34 (TOURNIQUET CUFF) ×1
CUFF TRNQT CYL 34X4.125X (TOURNIQUET CUFF) IMPLANT
DRAPE 3/4 80X56 (DRAPES) ×2 IMPLANT
DRSG DERMACEA 8X12 NADH (GAUZE/BANDAGES/DRESSINGS) ×2 IMPLANT
DRSG MEPILEX SACRM 8.7X9.8 (GAUZE/BANDAGES/DRESSINGS) ×2 IMPLANT
DRSG OPSITE POSTOP 4X14 (GAUZE/BANDAGES/DRESSINGS) ×2 IMPLANT
DRSG TEGADERM 4X4.75 (GAUZE/BANDAGES/DRESSINGS) ×2 IMPLANT
DURAPREP 26ML APPLICATOR (WOUND CARE) ×4 IMPLANT
ELECT REM PT RETURN 9FT ADLT (ELECTROSURGICAL) ×2
ELECTRODE REM PT RTRN 9FT ADLT (ELECTROSURGICAL) ×1 IMPLANT
EX-PIN ORTHOLOCK NAV 4X150 (PIN) ×4 IMPLANT
GLOVE BIO SURGEON STRL SZ7.5 (GLOVE) ×4 IMPLANT
GLOVE BIOGEL M STRL SZ7.5 (GLOVE) ×4 IMPLANT
GLOVE BIOGEL PI IND STRL 7.5 (GLOVE) ×1 IMPLANT
GLOVE BIOGEL PI INDICATOR 7.5 (GLOVE) ×1
GLOVE INDICATOR 8.0 STRL GRN (GLOVE) ×2 IMPLANT
GOWN STRL REUS W/ TWL LRG LVL3 (GOWN DISPOSABLE) ×2 IMPLANT
GOWN STRL REUS W/ TWL XL LVL3 (GOWN DISPOSABLE) ×1 IMPLANT
GOWN STRL REUS W/TWL LRG LVL3 (GOWN DISPOSABLE) ×2
GOWN STRL REUS W/TWL XL LVL3 (GOWN DISPOSABLE) ×1
HEMOVAC 400CC 10FR (MISCELLANEOUS) ×2 IMPLANT
HOLDER FOLEY CATH W/STRAP (MISCELLANEOUS) ×2 IMPLANT
HOOD PEEL AWAY FLYTE STAYCOOL (MISCELLANEOUS) ×4 IMPLANT
IRRIGATION SURGIPHOR STRL (IV SOLUTION) ×2 IMPLANT
KIT TURNOVER KIT A (KITS) ×2 IMPLANT
KNIFE SCULPS 14X20 (INSTRUMENTS) ×2 IMPLANT
LABEL OR SOLS (LABEL) ×2 IMPLANT
MANIFOLD NEPTUNE II (INSTRUMENTS) ×3 IMPLANT
NDL SAFETY ECLIPSE 18X1.5 (NEEDLE) ×1 IMPLANT
NDL SPNL 20GX3.5 QUINCKE YW (NEEDLE) ×2 IMPLANT
NEEDLE HYPO 18GX1.5 SHARP (NEEDLE) ×1
NEEDLE SPNL 20GX3.5 QUINCKE YW (NEEDLE) ×4 IMPLANT
NS IRRIG 500ML POUR BTL (IV SOLUTION) ×2 IMPLANT
PACK TOTAL KNEE (MISCELLANEOUS) ×2 IMPLANT
PAD WRAPON POLOR MULTI XL (MISCELLANEOUS) IMPLANT
PENCIL SMOKE EVACUATOR COATED (MISCELLANEOUS) ×2 IMPLANT
PENCIL SMOKE ULTRAEVAC 22 CON (MISCELLANEOUS) ×2 IMPLANT
PIN DRILL QUICK PACK ×2 IMPLANT
PIN FIXATION 1/8DIA X 3INL (PIN) ×6 IMPLANT
PULSAVAC PLUS IRRIG FAN TIP (DISPOSABLE) ×2
SOL .9 NS 3000ML IRR  AL (IV SOLUTION) ×1
SOL .9 NS 3000ML IRR UROMATIC (IV SOLUTION) ×1 IMPLANT
SOL PREP PVP 2OZ (MISCELLANEOUS) ×2
SOLUTION PREP PVP 2OZ (MISCELLANEOUS) ×1 IMPLANT
SPONGE DRAIN TRACH 4X4 STRL 2S (GAUZE/BANDAGES/DRESSINGS) ×2 IMPLANT
STAPLER SKIN PROX 35W (STAPLE) ×2 IMPLANT
STOCKINETTE IMPERV 14X48 (MISCELLANEOUS) ×1 IMPLANT
STRAP TIBIA SHORT (MISCELLANEOUS) ×2 IMPLANT
SUCTION FRAZIER HANDLE 10FR (MISCELLANEOUS) ×1
SUCTION TUBE FRAZIER 10FR DISP (MISCELLANEOUS) ×1 IMPLANT
SUT VIC AB 0 CT1 36 (SUTURE) ×4 IMPLANT
SUT VIC AB 1 CT1 36 (SUTURE) ×4 IMPLANT
SUT VIC AB 2-0 CT2 27 (SUTURE) ×2 IMPLANT
SYR 20ML LL LF (SYRINGE) ×2 IMPLANT
SYR 30ML LL (SYRINGE) ×4 IMPLANT
TIBIA ATTUNE KNEE SYS BASE SZ6 (Knees) ×2 IMPLANT
TIP FAN IRRIG PULSAVAC PLUS (DISPOSABLE) ×1 IMPLANT
TOWEL OR 17X26 4PK STRL BLUE (TOWEL DISPOSABLE) ×2 IMPLANT
TOWER CARTRIDGE SMART MIX (DISPOSABLE) ×2 IMPLANT
TRAY FOLEY MTR SLVR 16FR STAT (SET/KITS/TRAYS/PACK) ×2 IMPLANT
WRAP-ON POLOR PAD MULTI XL (MISCELLANEOUS) ×1
WRAPON POLOR PAD MULTI XL (MISCELLANEOUS) ×1

## 2020-06-17 NOTE — Progress Notes (Signed)
Patient awoke and complained of right shoulder pain , ice pack applied,

## 2020-06-17 NOTE — Anesthesia Procedure Notes (Signed)
Spinal  Patient location during procedure: OR Start time: 06/17/2020 12:04 PM End time: 06/17/2020 12:08 PM Staffing Performed: resident/CRNA  Anesthesiologist: Tera Mater, MD Resident/CRNA: Jerrye Noble, CRNA Preanesthetic Checklist Completed: patient identified, IV checked, site marked, risks and benefits discussed, surgical consent, monitors and equipment checked, pre-op evaluation and timeout performed Spinal Block Patient position: sitting Prep: ChloraPrep Patient monitoring: heart rate, continuous pulse ox, blood pressure and cardiac monitor Approach: midline Location: L4-5 Injection technique: single-shot Needle Needle type: Whitacre and Introducer  Needle gauge: 24 G Needle length: 9 cm Additional Notes Negative paresthesia. Negative blood return. Positive free-flowing CSF. Expiration date of kit checked and confirmed. Patient tolerated procedure well, without complications.

## 2020-06-17 NOTE — H&P (Signed)
ORTHOPAEDIC HISTORY & PHYSICAL Progress Notes [name], [place] - 06/11/2020 3:30 PM EDT [facility] Chief Complaint:   Chief Complaint  Patient presents with   Knee Pain  H & P RIGHT KNEE   History of Present Illness:   Brian Howell is a 49 y.o. male that presents to clinic today for his preoperative history and evaluation. Patient presents with his wife. The patient is scheduled to undergo a right total knee arthroplasty on 06/17/20 by Dr. Marry Guan. His pain began several years ago. The pain is located primarily along the medial aspect of the knee. He describes his pain as worse with weightbearing. He reports associated swelling with some giving way of the knee. He denies associated numbness or tingling, denies locking.   The patient's symptoms have progressed to the point that they decrease his quality of life. The patient has previously undergone conservative treatment including NSAIDS and injections to the knee without adequate control of his symptoms.  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 He explains that this means sometimes he will wake up and not know what year it is. His wife states that the best way to handle this is to have him go back to sleep, which she states helps reset him. Denies any violence during these episodes.   Patient is an insulin dependent diabetic. His last A1C was taken 06/05/20 and was 7.1.  Denies history of blood clots. He has received cardiac clearance from Dr. Ubaldo Glassing.  Past Medical, Surgical, Family, Social History, Allergies, Medications:   Past Medical History:  Past Medical History:  Diagnosis Date   Anxiety   Arthritis   Asthma, unspecified asthma severity, unspecified whether complicated, unspecified whether persistent 2008   Depression   Dissociative disorder 09/06/2015   Hyperlipidemia 1998   Hyperlipidemia, acquired   Hypertension   Migraine    Myocardial infarction (CMS-HCC)   Myocarditis, viral 10/25/2014  Overview: Overview: Admitted 2/25-2/26 with downtrending trop LHC negative Echo- diastolic dysfunction, EF 70% Last Assessment & Plan: Sx improving minimally but he has not taken colchicine 2/2 to cost. Worked with pharmacy to figure out pharmacy assistance benefit. Patient can receive 2 weeks. -- prescribed 1 week ibuprofen 600 mg TID -- prescribed colchicine 0.6 mg TID x 1 week, then 0.6 mg   Neuropathy   Obesity (BMI 30-39.9), unspecified   Obstructive sleep apnea  not compliant with CPAP   Pain syndrome, chronic 09/06/2015   Painful diabetic neuropathy (CMS-HCC) 09/06/2015   Type 2 diabetes mellitus (CMS-HCC)   Past Surgical History:  Past Surgical History:  Procedure Laterality Date   CHOLECYSTECTOMY   KNEE ARTHROSCOPY   Current Medications:  Current Outpatient Medications  Medication Sig Dispense Refill   clonazePAM (KLONOPIN) 0.5 MG tablet Take 0.25 mg by mouth nightly as needed   cyanocobalamin (VITAMIN B12) 1,000 mcg/mL injection INJECT INTRAMUSCULARLY 1 ML AS DIRECTED ONCE MONTHLY (DISCARD 28 DAYS AFTER FIRST USE) 3 mL 5   DULoxetine (CYMBALTA) 60 MG DR capsule Take 1 capsule (60 mg total) by mouth once daily 90 capsule 3   FUROsemide (LASIX) 20 MG tablet Take 1 tablet (20 mg total) by mouth once daily as needed for Edema 90 tablet 3   glucose chew tablet 4 gram chewable tablet Take 3 tablets by mouth as needed   hydrOXYzine (ATARAX) 25 MG tablet Take 25 mg by mouth 3 (three) times daily as needed   ibuprofen (MOTRIN) 800 MG tablet TAKE 1  TABLET BY MOUTH 3 TIMES DAILY AS NEEDED 270 tablet 3   insulin NPH (HUMULIN N NPH U-100 INSULIN) injection (concentration 100 units/mL) Inject subcutaneously 80 units at 9 AM and 40 units at 9 PM 140 mL 1   insulin REGULAR (HUMULIN R REGULAR U-100 INSULN) injection (concentration 100 units/mL) INJECT SUBCUTANEOUSLY UP TO 150 UNITS DAILY IN DIVIDED DOSES AS  DIRECTED 140 mL 1   losartan (COZAAR) 25 MG tablet Take 1 tablet (25 mg total) by mouth once daily 90 tablet 3   melatonin 10 mg Cap Take 1 capsule by mouth nightly as needed   metFORMIN (GLUCOPHAGE) 1000 MG tablet Take 1 tablet (1,000 mg total) by mouth 2 (two) times daily with meals 180 tablet 3   oxyCODONE (DAZIDOX) 10 mg immediate release tablet Take 1 tablet (10 mg total) by mouth every 4 (four) hours as needed for Pain for up to 30 days 150 tablet 0   simvastatin (ZOCOR) 20 MG tablet Take 1 tablet (20 mg total) by mouth nightly 90 tablet 3   tadalafiL (CIALIS) 20 MG tablet Take 1 tablet (20 mg total) by mouth every third day as needed for Erectile Dysfunction for up to 30 days 10 tablet 3   tiZANidine (ZANAFLEX) 4 MG tablet Take 1 tablet (4 mg total) by mouth every 8 (eight) hours as needed 270 tablet 3   traZODone (DESYREL) 50 MG tablet Take 1 tablet (50 mg total) by mouth nightly 90 tablet 3   No current facility-administered medications for this visit.   Allergies:  Allergies  Allergen Reactions   Ace Inhibitors Swelling   Gabapentin Rash   Social History:  Social History   Socioeconomic History   Marital status: Married  Spouse name: Melissa   Number of children: 4   Years of education: 14   Highest education level: Not on file  Occupational History   Occupation: Disabled  Tobacco Use   Smoking status: Never Smoker   Smokeless tobacco: Never Used  Scientific laboratory technician Use: Never used  Substance and Sexual Activity   Alcohol use: No   Drug use: No   Sexual activity: Not Currently  Partners: Female  Other Topics Concern   Not on file  Social History Narrative   Not on file   Social Determinants of Health   Financial Resource Strain: Not on file  Food Insecurity: Not on file  Transportation Needs: Not on file  Physical Activity: Not on file  Stress: Not on file  Social Connections: Not on file  Housing Stability: Not on file   Family  History:  Family History  Problem Relation Age of Onset   Myocardial Infarction (Heart attack) Mother   High blood pressure (Hypertension) Mother   Stroke Mother   Coronary Artery Disease (Blocked arteries around heart) Mother   Diabetes Mother   Diabetes type II Mother   Myocardial Infarction (Heart attack) Father   High blood pressure (Hypertension) Father   Coronary Artery Disease (Blocked arteries around heart) Father   Diabetes Sister   Stroke Sister   Cancer Sister  ovarian   Coronary Artery Disease (Blocked arteries around heart) Sister   Diabetes type II Sister   High blood pressure (Hypertension) Sister   Diabetes Brother   High blood pressure (Hypertension) Brother   Diabetes Maternal Grandmother   Heart disease Maternal Grandmother   High blood pressure (Hypertension) Maternal Grandmother   Coronary Artery Disease (Blocked arteries around heart) Maternal Grandmother   Diabetes Maternal Grandfather  Heart disease Maternal Grandfather   Myocardial Infarction (Heart attack) Maternal Grandfather   High blood pressure (Hypertension) Maternal Grandfather   Myocardial Infarction (Heart attack) Paternal Grandfather   Myocardial Infarction (Heart attack) Maternal Aunt   High blood pressure (Hypertension) Maternal Aunt   Myocardial Infarction (Heart attack) Maternal Uncle   High blood pressure (Hypertension) Maternal Uncle   No Known Problems Paternal Grandmother   Diabetes type II Sister   High blood pressure (Hypertension) Sister   Review of Systems:   A 10+ ROS was performed, reviewed, and the pertinent orthopaedic findings are documented in the HPI.   Physical Examination:   BP 120/74   Ht 177.8 cm (5\' 10" )   Wt (!) 126.3 kg (278 lb 6.4 oz)   BMI 39.95 kg/m   Patient is a well-developed, well-nourished male in no acute distress. Patient has normal mood and affect. Patient is alert and oriented to person, place, and time.   HEENT:  Atraumatic, normocephalic. Pupils equal and reactive to light. Extraocular motion intact. Noninjected sclera.  Cardiovascular: Regular rate and rhythm, with no murmurs, rubs, or gallops. Distal pulses palpable.  Respiratory: Lungs clear to auscultation bilaterally.   Right Knee: Soft tissue swelling: mild Effusion: minimal Erythema: none Crepitance: mild Tenderness: medial Alignment: relative varus Mediolateral laxity: medial pseudolaxity Posterior sag: negative Patellar tracking: Good tracking without evidence of subluxation or tilt Atrophy: No significant atrophy.  Quadriceps tone was fair to good. Range of motion: 0/6/117 degrees   Sensation intact to pinprick over the lateral sural cutaneous distribution, decreased to pinprick over the saphenous and absent over the superficial fibular and deep fibular nerve distributions.  Tests Performed/Reviewed:  X-rays  No new radiographs were obtained today. Previous radiographs were reviewed of the right knee and revealed near complete loss of medial compartment joint space with mild osteophyte formation. Lateral compartment reveals mild loss of joint space with osteophyte formation. Patellofemoral joint reveals mild loss of joint space without significant osteophyte formation. No fractures or dislocations.  Impression:   ICD-10-CM  1. Primary osteoarthritis of right knee M17.11   Plan:   The patient has end-stage degenerative changes of the right knee. It was explained to the patient that the condition is progressive in nature. Having failed conservative treatment, the patient has elected to proceed with a total joint arthroplasty. The patient will undergo a total joint arthroplasty with Dr. Marry Guan. The risks of surgery, including blood clot and infection, were discussed with the patient. Measures to reduce these risks, including the use of anticoagulation, perioperative antibiotics, and early ambulation were discussed. The importance of  postoperative physical therapy was discussed with the patient. The patient elects to proceed with surgery. The patient is instructed to stop all blood thinners prior to surgery. The patient is instructed to call the hospital the day before surgery to learn of the proper arrival time.   Contact our office with any questions or concerns. Follow up as indicated, or sooner should any new problems arise, if conditions worsen, or if they are otherwise concerned.   [name], PA-C [facility] [place], [place] [address] Phone: [phone]  This note was generated in part with voice recognition software and I apologize for any typographical errors that were not detected and corrected.   Electronically signed by [name], PA at 06/11/2020 11:02 PM EDT

## 2020-06-17 NOTE — Transfer of Care (Signed)
Immediate Anesthesia Transfer of Care Note  Patient: Brian Howell  Procedure(s) Performed: COMPUTER ASSISTED TOTAL KNEE ARTHROPLASTY (Right Knee)  Patient Location: PACU  Anesthesia Type:Spinal  Level of Consciousness: awake, drowsy and patient cooperative  Airway & Oxygen Therapy: Patient Spontanous Breathing and Patient connected to face mask oxygen  Post-op Assessment: Report given to RN and Post -op Vital signs reviewed and stable  Post vital signs: Reviewed and stable  Last Vitals:  Vitals Value Taken Time  BP 110/64 06/17/20 1601  Temp    Pulse 75 06/17/20 1601  Resp 12 06/17/20 1601  SpO2 100 % 06/17/20 1601  Vitals shown include unvalidated device data.  Last Pain:  Vitals:   06/17/20 1035  TempSrc: Oral  PainSc: 0-No pain         Complications: No complications documented.

## 2020-06-17 NOTE — Op Note (Signed)
OPERATIVE NOTE  DATE OF SURGERY:  06/17/2020  PATIENT NAME:  Brian Howell   DOB: 04-29-1971  MRN: [id number]  PRE-OPERATIVE DIAGNOSIS: Degenerative arthrosis of the right knee, primary  POST-OPERATIVE DIAGNOSIS:  Same  PROCEDURE:  Right total knee arthroplasty using computer-assisted navigation  SURGEON:  Marciano Sequin. M.D.  ASSISTANT: Cassell Smiles, PA-C (present and scrubbed throughout the case, critical for assistance with exposure, retraction, instrumentation, and closure)  ANESTHESIA: spinal  ESTIMATED BLOOD LOSS: 100 mL  FLUIDS REPLACED: 1200 mL of crystalloid  TOURNIQUET TIME: 108 minutes  DRAINS: 2 medium Hemovac drains  SOFT TISSUE RELEASES: Anterior cruciate ligament, posterior cruciate ligament, deep medial collateral ligament, patellofemoral ligament  IMPLANTS UTILIZED: DePuy Attune size 5 posterior stabilized femoral component (cemented), size 6 rotating platform tibial component (cemented), 41 mm medialized dome patella (cemented), and a 5 mm stabilized rotating platform polyethylene insert.  INDICATIONS FOR SURGERY: Brian Howell is a 49 y.o. year old male with a long history of progressive knee pain. X-rays demonstrated severe degenerative changes in tricompartmental fashion. The patient had not seen any significant improvement despite conservative nonsurgical intervention. After discussion of the risks and benefits of surgical intervention, the patient expressed understanding of the risks benefits and agree with plans for total knee arthroplasty.   The risks, benefits, and alternatives were discussed at length including but not limited to the risks of infection, bleeding, nerve injury, stiffness, blood clots, the need for revision surgery, cardiopulmonary complications, among others, and they were willing to proceed.  PROCEDURE IN DETAIL: The patient was brought into the operating room and, after adequate spinal anesthesia was achieved, a tourniquet was  placed on the patient's upper thigh. The patient's knee and leg were cleaned and prepped with alcohol and DuraPrep and draped in the usual sterile fashion. A "timeout" was performed as per usual protocol. The lower extremity was exsanguinated using an Esmarch, and the tourniquet was inflated to 300 mmHg. An anterior longitudinal incision was made followed by a standard mid vastus approach. The deep fibers of the medial collateral ligament were elevated in a subperiosteal fashion off of the medial flare of the tibia so as to maintain a continuous soft tissue sleeve. The patella was subluxed laterally and the patellofemoral ligament was incised. Inspection of the knee demonstrated severe degenerative changes with full-thickness loss of articular cartilage. Osteophytes were debrided using a rongeur. Anterior and posterior cruciate ligaments were excised. Two 4.0 mm Schanz pins were inserted in the femur and into the tibia for attachment of the array of trackers used for computer-assisted navigation. Hip center was identified using a circumduction technique. Distal landmarks were mapped using the computer. The distal femur and proximal tibia were mapped using the computer. The distal femoral cutting guide was positioned using computer-assisted navigation so as to achieve a 5 distal valgus cut. The femur was sized and it was felt that a size 5 femoral component was appropriate. A size 5 femoral cutting guide was positioned and the anterior cut was performed and verified using the computer. This was followed by completion of the posterior and chamfer cuts. Femoral cutting guide for the central box was then positioned in the center box cut was performed.  Attention was then directed to the proximal tibia. Medial and lateral menisci were excised. The extramedullary tibial cutting guide was positioned using computer-assisted navigation so as to achieve a 0 varus-valgus alignment and 3 posterior slope. The cut was  performed and verified using the computer. The proximal tibia was sized and  it was felt that a size 6 tibial tray was appropriate. Tibial and femoral trials were inserted followed by insertion of a 5 mm polyethylene insert.  The knee was felt to be tight both in flexion and in extension.  The extra medullary tibial cutting guide was repositioned and an additional 2 mm of bone was resected from the proximal tibia.  The cut was verified using the computer.  The trial implants were reinserted.  This allowed for excellent mediolateral soft tissue balancing both in flexion and in full extension. Finally, the patella was cut and prepared so as to accommodate a 41 mm medialized dome patella. A patella trial was placed and the knee was placed through a range of motion with excellent patellar tracking appreciated. The femoral trial was removed after debridement of posterior osteophytes. The central post-hole for the tibial component was reamed followed by insertion of a keel punch. Tibial trials were then removed. Cut surfaces of bone were irrigated with copious amounts of normal saline using pulsatile lavage and then suctioned dry. Polymethylmethacrylate cement with gentamicin was prepared in the usual fashion using a vacuum mixer. Cement was applied to the cut surface of the proximal tibia as well as along the undersurface of a size 6 rotating platform tibial component. Tibial component was positioned and impacted into place. Excess cement was removed using [employer] fast streamer. Cement was then applied to the cut surfaces of the femur as well as along the posterior flanges of the size 5 femoral component. The femoral component was positioned and impacted into place. Excess cement was removed using [employer] fast streamer. A 5 mm polyethylene trial was inserted and the knee was brought into full extension with steady axial compression applied. Finally, cement was applied to the backside of a 41 mm medialized dome patella and the  patellar component was positioned and patellar clamp applied. Excess cement was removed using [employer] fast streamer. After adequate curing of the cement, the tourniquet was deflated after a total tourniquet time of 108 minutes. Hemostasis was achieved using electrocautery. The knee was irrigated with copious amounts of normal saline using pulsatile lavage followed by 500 ml of Surgiphor and then suctioned dry. 20 mL of 1.3% Exparel and 60 mL of 0.25% Marcaine in 40 mL of normal saline was injected along the posterior capsule, medial and lateral gutters, and along the arthrotomy site. A 5 mm stabilized rotating platform polyethylene insert was inserted and the knee was placed through a range of motion with excellent mediolateral soft tissue balancing appreciated and excellent patellar tracking noted. 2 medium drains were placed in the wound bed and brought out through separate stab incisions. The medial parapatellar portion of the incision was reapproximated using interrupted sutures of #1 Vicryl. Subcutaneous tissue was approximated in layers using first #0 Vicryl followed #2-0 Vicryl. The skin was approximated with skin staples. A sterile dressing was applied.  The patient tolerated the procedure well and was transported to the recovery room in stable condition.    Elyse Prevo P. Holley Bouche., M.D.

## 2020-06-17 NOTE — H&P (Signed)
The patient has been re-examined, and the chart reviewed, and there have been no interval changes to the documented history and physical.    The risks, benefits, and alternatives have been discussed at length. The patient expressed understanding of the risks benefits and agreed with plans for surgical intervention.  Annely Sliva P. Lizann Edelman, [name] M.D.    

## 2020-06-17 NOTE — Anesthesia Preprocedure Evaluation (Addendum)
Anesthesia Evaluation  Patient identified by MRN, date of birth, ID band Patient awake    Reviewed: Allergy & Precautions, H&P , NPO status , Patient's Chart, lab work & pertinent test results  History of Anesthesia Complications Negative for: history of anesthetic complications  Airway Mallampati: III  TM Distance: >3 FB Neck ROM: full   Comment: Large neck Dental  (+) Poor Dentition, Chipped, Missing   Pulmonary sleep apnea and Continuous Positive Airway Pressure Ventilation , neg COPD,    breath sounds clear to auscultation       Cardiovascular hypertension, (-) angina(-) Past MI and (-) Cardiac Stents (-) dysrhythmias  Rhythm:regular Rate:Normal     Neuro/Psych  Headaches, PSYCHIATRIC DISORDERS Anxiety Peripheral neuropathy    GI/Hepatic negative GI ROS, Neg liver ROS,   Endo/Other  diabetes  Renal/GU      Musculoskeletal   Abdominal   Peds  Hematology negative hematology ROS (+)   Anesthesia Other Findings Obese, BMI 39  Past Medical History: No date: Anxiety No date: Arthritis No date: Diabetes mellitus, type II (HCC) No date: Headache No date: Hypertension No date: Memory deficit No date: Neuropathy No date: Sleep apnea     Comment:  cpap  Past Surgical History: No date: CHOLECYSTECTOMY No date: KNEE ARTHROSCOPY; Right     Comment:  meniscus     Reproductive/Obstetrics negative OB ROS                            Anesthesia Physical Anesthesia Plan  ASA: III  Anesthesia Plan: Spinal   Post-op Pain Management:    Induction:   PONV Risk Score and Plan: Propofol infusion and Ondansetron  Airway Management Planned: Simple Face Mask  Additional Equipment:   Intra-op Plan:   Post-operative Plan:   Informed Consent: I have reviewed the patients History and Physical, chart, labs and discussed the procedure including the risks, benefits and alternatives for the  proposed anesthesia with the patient or authorized representative who has indicated his/her understanding and acceptance.     Dental Advisory Given  Plan Discussed with: Anesthesiologist, CRNA and Surgeon  Anesthesia Plan Comments:        Anesthesia Quick Evaluation

## 2020-06-17 NOTE — Anesthesia Postprocedure Evaluation (Signed)
Anesthesia Post Note  Patient: Brian Howell  Procedure(s) Performed: COMPUTER ASSISTED TOTAL KNEE ARTHROPLASTY (Right Knee)  Patient location during evaluation: PACU Anesthesia Type: Spinal Level of consciousness: oriented and awake and alert Pain management: pain level controlled Vital Signs Assessment: post-procedure vital signs reviewed and stable Respiratory status: spontaneous breathing, respiratory function stable and patient connected to nasal cannula oxygen Cardiovascular status: blood pressure returned to baseline and stable Postop Assessment: no headache, no backache and no apparent nausea or vomiting Anesthetic complications: no   No complications documented.   Last Vitals:  Vitals:   06/17/20 1645 06/17/20 1700  BP: 103/66 98/64  Pulse: 85 66  Resp: 13 11  Temp:    SpO2: 95% 99%    Last Pain:  Vitals:   06/17/20 1700  TempSrc:   PainSc: 0-No pain    LLE Motor Response: No movement due to regional block (06/17/20 1700) LLE Sensation: No sensation (absent) (06/17/20 1700) RLE Motor Response: No movement due to regional block (06/17/20 1700) RLE Sensation: No sensation (absent) (06/17/20 1700)      Arita Miss

## 2020-06-18 ENCOUNTER — Encounter: Payer: Self-pay | Admitting: Orthopedic Surgery

## 2020-06-18 LAB — GLUCOSE, CAPILLARY
Glucose-Capillary: 316 mg/dL — ABNORMAL HIGH (ref 70–99)
Glucose-Capillary: 340 mg/dL — ABNORMAL HIGH (ref 70–99)
Glucose-Capillary: 315 mg/dL — ABNORMAL HIGH (ref 70–99)
Glucose-Capillary: 285 mg/dL — ABNORMAL HIGH (ref 70–99)
Glucose-Capillary: 216 mg/dL — ABNORMAL HIGH (ref 70–99)

## 2020-06-18 NOTE — Plan of Care (Signed)

## 2020-06-18 NOTE — Evaluation (Signed)
Occupational Therapy Evaluation Patient Details Name: Brian Howell MRN: [id number] DOB: August 21, 1971 Today's Date: 06/18/2020    History of Present Illness Patient is a 49 y.o. male  with degenerative arthrosis of the right knee s/p right total knee arthroplasty. Past medical history of besity, diabetes, arthritis, anxiety, memory deficit, neuropathy, sleep apnea, right knee arthroscopy   Clinical Impression   Pt seen for OT eval this date in setting of f/u after TKR (right). Pt reports living with spouse in 2 story home with BR/BA on 2nd floor. Pt requires CGA with ADL transfers with RW, MOD A with LB dressing and SETUP for seated UB ADLs. Pt with 5/10 pain during session only when mobilizing and reports relief from polar care. OT educates re: compression stocking, polar care mgt, LB dressing modifications, and role of OT. Pt with good understanding. Anticipate pt will require HHOT upon d/c from acute setting.     Follow Up Recommendations  Home health OT    Equipment Recommendations  3 in 1 bedside commode    Recommendations for Other Services       Precautions / Restrictions Precautions Precautions: Fall Restrictions Weight Bearing Restrictions: Yes RLE Weight Bearing: Weight bearing as tolerated Other Position/Activity Restrictions: KI not needed, pt able to SLR      Mobility Bed Mobility Overal bed mobility: Needs Assistance Bed Mobility: Supine to Sit;Sit to Supine     Supine to sit: Supervision Sit to supine: Supervision   General bed mobility comments: pt up to chair pre/post session    Transfers Overall transfer level: Needs assistance Equipment used: Rolling walker (2 wheeled) Transfers: Sit to/from Stand Sit to Stand: Min guard         General transfer comment: despite cues for hand placement with standing, patient still pulling up on rolling walker. cues for RLE positioning with sitting     Balance Overall balance assessment: Needs  assistance Sitting-balance support: No upper extremity supported Sitting balance-Leahy Scale: Good     Standing balance support: Bilateral upper extremity supported Standing balance-Leahy Scale: Fair Standing balance comment: UE support on RW.                           ADL either performed or assessed with clinical judgement   ADL Overall ADL's : Needs assistance/impaired                                       General ADL Comments: MOD A with LB ADLs, SETUP with UB     Vision Baseline Vision/History: Wears glasses Wears Glasses: At all times Patient Visual Report: No change from baseline       Perception     Praxis      Pertinent Vitals/Pain Pain Assessment: 0-10 Pain Score: 5  Pain Location: right knee  Pain Descriptors / Indicators: Sore Pain Intervention(s): Monitored during session;Ice applied     Hand Dominance     Extremity/Trunk Assessment Upper Extremity Assessment Upper Extremity Assessment: Overall WFL for tasks assessed   Lower Extremity Assessment Lower Extremity Assessment: Defer to PT evaluation;RLE deficits/detail RLE Deficits / Details: decreased hip int/ext rotation and knee flex/ext for LB dressing, but able to move all joints to som degree.       Communication Communication Communication: No difficulties   Cognition Arousal/Alertness: Awake/alert Behavior During Therapy: WFL for tasks assessed/performed Overall Cognitive Status: Within Functional  Limits for tasks assessed                                 General Comments: patient able to follow single step commands without difficulty    General Comments       Exercises Exercises: Other exercises Other Exercises Other Exercises: OT facilitates ed re: role of OT, polar care mgt, safety, LB dressing modifications.   Shoulder Instructions      Home Living Family/patient expects to be discharged to:: Private residence Living Arrangements:  Spouse/significant other Available Help at Discharge: Available PRN/intermittently;Family Type of Home: House Home Access: Stairs to enter [employer] of Steps: 1 Entrance Stairs-Rails: None Home Layout: Two level;Bed/bath upstairs;Full bath on main level   Alternate Level Stairs-Rails: Left           Home Equipment: Walker - 4 wheels;Cane - single point          Prior Functioning/Environment Level of Independence: Independent with assistive device(s)        Comments: patient reports frequent falls due to right knee buckling. patient intermittently using rollator and or cane recently. previously was independent with mobility         OT Problem List: Decreased strength;Decreased range of motion;Decreased activity tolerance;Decreased knowledge of use of DME or AE;Pain      OT Treatment/Interventions: Self-care/ADL training;DME and/or AE instruction;Therapeutic activities;Balance training;Therapeutic exercise;Energy conservation;Patient/family education    OT Goals(Current goals can be found in the care plan section) Acute Rehab OT Goals Patient Stated Goal: to go home  OT Goal Formulation: With patient Time For Goal Achievement: 07/02/20 Potential to Achieve Goals: Good ADL Goals Pt Will Perform Lower Body Dressing: with modified independence;sit to/from stand (with AE PRN) Pt Will Transfer to Toilet: with modified independence;ambulating;grab bars (with LRAD to restroom) Pt Will Perform Toileting - Clothing Manipulation and hygiene: with modified independence;sit to/from stand Additional ADL Goal #1: Pt will INDEP'ly verbalize polar care mgt to teach spouse  OT Frequency: Min 1X/week   Barriers to D/C:            Co-evaluation              AM-PAC OT "6 Clicks" Daily Activity     Outcome Measure Help from another person eating meals?: None Help from another person taking care of personal grooming?: A Little Help from another person toileting,  which includes using toliet, bedpan, or urinal?: A Little Help from another person bathing (including washing, rinsing, drying)?: A Lot Help from another person to put on and taking off regular upper body clothing?: None Help from another person to put on and taking off regular lower body clothing?: A Lot 6 Click Score: 18   End of Session Equipment Utilized During Treatment: Gait belt;Rolling walker Nurse Communication: Mobility status  Activity Tolerance: Patient tolerated treatment well Patient left: in chair;with call bell/phone within reach;with chair alarm set  OT Visit Diagnosis: Other abnormalities of gait and mobility (R26.89)                Time: 9937-1696 OT Time Calculation (min): 23 min Charges:  OT General Charges $OT Visit: 1 Visit OT Evaluation $OT Eval Moderate Complexity: 1 Mod OT Treatments $Self Care/Home Management : 8-22 mins  Gerrianne Scale, [place], OTR/L ascom [phone] 06/18/20, 5:17 PM

## 2020-06-18 NOTE — Progress Notes (Signed)
Pt BS 402. Dr. Roland Rack notified. New order received. Will cont to monitor pt.

## 2020-06-18 NOTE — Progress Notes (Signed)
  Subjective: 1 Day Post-Op Procedure(s) (LRB): COMPUTER ASSISTED TOTAL KNEE ARTHROPLASTY (Right) Patient reports pain as well-controlled.   Patient is well, and has had no acute complaints or problems Plan is to go Home after hospital stay. Negative for chest pain and shortness of breath Fever: no Gastrointestinal: negative for nausea and vomiting.   Patient has not had a bowel movement. but reports passing gas.  Objective: Vital signs in last 24 hours: Temp:  [97 F (36.1 C)-99 F (37.2 C)] 97.8 F (36.6 C) (10/26 0533) Pulse Rate:  [64-98] 80 (10/26 0533) Resp:  [10-18] 18 (10/26 0533) BP: (85-158)/(51-82) 111/56 (10/26 0533) SpO2:  [91 %-100 %] 94 % (10/26 0533) Weight:  [800 kg] 127 kg (10/25 1035)  Intake/Output from previous day:  Intake/Output Summary (Last 24 hours) at 06/18/2020 0756 Last data filed at 06/18/2020 0600 Gross per 24 hour  Intake 1640 ml  Output 2730 ml  Net -1090 ml    Intake/Output this shift: No intake/output data recorded.  Labs: No results for input(s): HGB in the last 72 hours. No results for input(s): WBC, RBC, HCT, PLT in the last 72 hours. No results for input(s): NA, K, CL, CO2, BUN, CREATININE, GLUCOSE, CALCIUM in the last 72 hours. No results for input(s): LABPT, INR in the last 72 hours.   EXAM General - Patient is Alert, Appropriate and Oriented Extremity - Neurovascular intact Dorsiflexion/Plantar flexion intact Compartment soft Dressing/Incision -Postoperative dressing remains in place., Polar Care in place and working. , Hemovac in place.  Motor Function - intact, moving foot and toes well on exam.  Cardiovascular- Regular rate and rhythm, no murmurs/rubs/gallops Respiratory- Lungs clear to auscultation bilaterally Gastrointestinal- soft, nontender and active bowel sounds   Assessment/Plan: 1 Day Post-Op Procedure(s) (LRB): COMPUTER ASSISTED TOTAL KNEE ARTHROPLASTY (Right) Active Problems:   Total knee replacement  status  Estimated body mass index is 39.05 kg/m as calculated from the following:   Height as of this encounter: 5\' 11"  (1.803 m).   Weight as of this encounter: 127 kg. Advance diet Up with therapy Will consider discharge this PM if patient meets therapy goals.    DVT Prophylaxis - Lovenox, Ted hose and foot pumps Weight-Bearing as tolerated to right leg  Cassell Smiles, PA-C [facility] Orthopaedic Surgery 06/18/2020, 7:56 AM

## 2020-06-18 NOTE — Evaluation (Signed)
Physical Therapy Evaluation Patient Details Name: Brian Howell MRN: [id number] DOB: 1971/01/23 Today's Date: 06/18/2020   History of Present Illness  Patient is a 49 y.o. male  with degenerative arthrosis of the right knee s/p right total knee arthroplasty. Past medical history of obesity, diabetes, arthritis, anxiety, memory deficit, neuropathy, sleep apnea, right knee arthroscopy  Clinical Impression  PT evaluation completed.  Patient required supervision for bed mobility, Min guard assistance for sit to stand transfers with verbal cues for RLE positioning and hand placement for safety. Patient ambulated in room and around nursing station with rolling walker and cues for improved gait kinematics, progressing to step through pattern. Patient able to complete SLR and LAQ x 10 reps independently on RLE. Pain initially 0/10 and progressed to 4/10 with weight bearing and mobility in right knee. Patient is motivated to regain independence and participate with therapy. Patient has a second floor bedroom and home and will need stair training prior to discharge. Recommend to continue PT to address functional limitations listed below to maximize independence in preparation for discharge home. Patient does express hesitation about discharging home today. PT will return for second session in afternoon.     Follow Up Recommendations Home health PT    Equipment Recommendations  Rolling walker with 5" wheels    Recommendations for Other Services       Precautions / Restrictions Precautions Precautions: Fall Required Braces or Orthoses:  (KI not required as patient SLR x 10 reps independently ) Restrictions Weight Bearing Restrictions: Yes RLE Weight Bearing: Weight bearing as tolerated      Mobility  Bed Mobility Overal bed mobility: Needs Assistance Bed Mobility: Supine to Sit;Sit to Supine     Supine to sit: Supervision;HOB elevated     General bed mobility comments: supervision for  safety     Transfers Overall transfer level: Needs assistance   Transfers: Sit to/from Stand Sit to Stand: Min guard         General transfer comment: verbal cues for hand placement and RLE positioning for safety   Ambulation/Gait Ambulation/Gait assistance: Min guard Gait Distance (Feet): 200 Feet Assistive device: Rolling walker (2 wheeled) (bariatric rolling walker ) Gait Pattern/deviations: Step-to pattern;Step-through pattern;Decreased stance time - right Gait velocity: decreased    General Gait Details: step to pattern initially progressing to step through pattern. cues for heel toe gait pattern and negotiation of rolling walker   Stairs            Wheelchair Mobility    Modified Rankin (Stroke Patients Only)       Balance Overall balance assessment: Needs assistance Sitting-balance support: No upper extremity supported;Feet supported Sitting balance-Leahy Scale: Good     Standing balance support: Bilateral upper extremity supported Standing balance-Leahy Scale: Fair Standing balance comment: patient relying heavily on rolling walker for UE support                              Pertinent Vitals/Pain Pain Assessment: 0-10 Pain Score: 4  Pain Location: right knee  Pain Descriptors / Indicators: Sore Pain Intervention(s): Monitored during session;Premedicated before session;Limited activity within patient's tolerance;Ice applied (polar care reapplied at end of session )    [place] expects to be discharged to:: Private residence Living Arrangements: Spouse/significant other Available Help at Discharge: Available PRN/intermittently;Family (spouse works during the day ) Type of Home: House Home Access:  (one step up )     Home Layout:  Two level;Bed/bath upstairs;Full bath on main level Home Equipment: Walker - 4 wheels;Cane - single point      Prior Function Level of Independence: Independent with assistive device(s)          Comments: patient reports frequent falls due to right knee buckling. patient intermittently using rollator and or cane recently. previously was independent with mobility      Hand Dominance        Extremity/Trunk Assessment   Upper Extremity Assessment Upper Extremity Assessment: Overall WFL for tasks assessed    Lower Extremity Assessment Lower Extremity Assessment: RLE deficits/detail;LLE deficits/detail RLE Deficits / Details: patient is able to SLR x 10 reps independently in supine position. patient able to activate hip/knee/ankle movement and stand without knee buckling  RLE Sensation: decreased light touch;history of peripheral neuropathy LLE Deficits / Details: WFL  LLE Sensation: decreased light touch;history of peripheral neuropathy       Communication   Communication: No difficulties  Cognition Arousal/Alertness: Awake/alert Behavior During Therapy: WFL for tasks assessed/performed Overall Cognitive Status: Within Functional Limits for tasks assessed                                 General Comments: patient able to follow single step commands without difficulty       General Comments      Exercises Total Joint Exercises Ankle Circles/Pumps: AROM;Strengthening;Right;10 reps;Supine Straight Leg Raises: AROM;Strengthening;Right;10 reps;Supine Long Arc Quad: AROM;Strengthening;Right;10 reps;Seated Goniometric ROM: right knee AROM 4-79 degrees.    Assessment/Plan    PT Assessment Patient needs continued PT services  PT Problem List Decreased strength;Decreased range of motion;Decreased activity tolerance;Decreased balance;Decreased mobility;Decreased safety awareness;Decreased knowledge of use of DME;Pain       PT Treatment Interventions DME instruction;Gait training;Stair training;Functional mobility training;Therapeutic exercise;Therapeutic activities;Balance training;Patient/family education    PT Goals (Current goals can be found  in the Care Plan section)  Acute Rehab PT Goals Patient Stated Goal: to go home  PT Goal Formulation: With patient Time For Goal Achievement: 07/02/20 Potential to Achieve Goals: Good Additional Goals Additional Goal #1: patient will increase right knee flexion to 90 degrees to increase independence with functional mobility    Frequency BID   Barriers to discharge        Co-evaluation               AM-PAC PT "6 Clicks" Mobility  Outcome Measure Help needed turning from your back to your side while in a flat bed without using bedrails?: A Little Help needed moving from lying on your back to sitting on the side of a flat bed without using bedrails?: A Little Help needed moving to and from a bed to a chair (including a wheelchair)?: A Little Help needed standing up from a chair using your arms (e.g., wheelchair or bedside chair)?: A Little Help needed to walk in hospital room?: A Little Help needed climbing 3-5 steps with a railing? : A Little 6 Click Score: 18    End of Session Equipment Utilized During Treatment: Gait belt Activity Tolerance: Patient tolerated treatment well Patient left: in bed;with call bell/phone within reach;with SCD's reapplied;with chair alarm set;with nursing/sitter in room (polar care reapplied right knee ) Nurse Communication: Mobility status PT Visit Diagnosis: Other abnormalities of gait and mobility (R26.89);Difficulty in walking, not elsewhere classified (R26.2);Pain Pain - Right/Left: Right Pain - part of body: Knee    Time: 7494-4967 PT Time Calculation (min) (ACUTE  ONLY): 30 min   Charges:   PT Evaluation $PT Eval Moderate Complexity: 1 Mod PT Treatments $Gait Training: 8-22 mins $Therapeutic Exercise: 8-22 mins       Minna Merritts, PT, MPT   Percell Locus 06/18/2020, 10:36 AM

## 2020-06-18 NOTE — TOC Progression Note (Signed)
Transition of Care [facility]) - Progression Note    Patient Details  Name: Brian Howell MRN: [id number] Date of Birth: Dec 02, 1970  [facility] CM/SW [place], RN Phone Number: 06/18/2020, 3:31 PM  Clinical Narrative:   RNCM met with patient at bedside to discuss discharge plans. Patient reports that he lives at home with his wife and is normally independent. He reports that it is his plan to go home with home health and his wife has already gotten a call from [facility] to set up and appointment for evaluation. Patient reports that he believes he will be leaving tomorrow. He is agreeable to having rolling walker and 3N1 set up.  RNCM reached out to [place] with [facility] re home health.  RNCM reached out to [facility] with [employer] for equipment.          Expected Discharge Plan and Services                                                 Social Determinants of Health (SDOH) Interventions    Readmission Risk Interventions No flowsheet data found.

## 2020-06-18 NOTE — Progress Notes (Addendum)
Physical Therapy Treatment Patient Details Name: Brian Howell MRN: [id number] DOB: 09/28/1970 Today's Date: 06/18/2020    History of Present Illness Patient is a 49 y.o. male  with degenerative arthrosis of the right knee s/p right total knee arthroplasty. Past medical history of besity, diabetes, arthritis, anxiety, memory deficit, neuropathy, sleep apnea, right knee arthroscopy    PT Comments    Patient is making progress towards PT goals. Patient declined stair training this session but is agreeable to attempt in the morning. Patient ambulated in the room and around nursing station with rolling walker and Min guard assistance with intermittent cues for safety and gait kinematics using rolling walker. Patient is impulsive initially with sitting up in bed, attempting to sit up before polar care can be removed for mobility. Cues for safety provided during session. Patient reports pain at 4-5/10 in right knee during ambulation and no dizziness with standing activity. Recommend to continue PT to progress mobility and stair training tomorrow prior to discharge home.    Follow Up Recommendations  Home health PT     Equipment Recommendations  Rolling walker with 5" wheels;3in1 (PT)    Recommendations for Other Services       Precautions / Restrictions Precautions Precautions: Fall Restrictions Weight Bearing Restrictions: Yes RLE Weight Bearing: Weight bearing as tolerated    Mobility  Bed Mobility Overal bed mobility: Needs Assistance Bed Mobility: Supine to Sit;Sit to Supine     Supine to sit: Supervision Sit to supine: Supervision   General bed mobility comments: verbal cues for safety and technique. patient impulsive initially with attmepting to get OOB with polar care still in place.  Transfers Overall transfer level: Needs assistance   Transfers: Sit to/from Stand Sit to Stand: Min guard         General transfer comment: despite cues for hand placement with  standing, patient still pulling up on rolling walker. cues for RLE positioning with sitting   Ambulation/Gait Ambulation/Gait assistance: Min guard Gait Distance (Feet): 200 Feet Assistive device: Rolling walker (2 wheeled) Gait Pattern/deviations: Step-through pattern Gait velocity: decreased    General Gait Details: cues for heel-toe gait pattern and technique using rolling walker. no loss of balance. Min guard provided for safety and patient relying heavily on rolling walker for UE support    Stairs         General stair comments: offered stair training, however patient declined at this time. patient wants to perform stair training tomorrow morning prior to discharge. Patient provided with handout for stair training technique using railing and cane as he has one rail to get to upstairs bedroom.    Wheelchair Mobility    Modified Rankin (Stroke Patients Only)       Balance Overall balance assessment: Needs assistance Sitting-balance support: No upper extremity supported Sitting balance-Leahy Scale: Good     Standing balance support: Bilateral upper extremity supported Standing balance-Leahy Scale: Fair Standing balance comment: patient relying heavily on rolling walker for UE support                             Cognition Arousal/Alertness: Awake/alert Behavior During Therapy: WFL for tasks assessed/performed Overall Cognitive Status: Within Functional Limits for tasks assessed                                 General Comments: patient able to follow single step commands without  difficulty       Exercises Other Exercises Other Exercises: encouraged TKA home exercise program. patient educated on positioning of RLE in bed to focus on knee extension.     General Comments        Pertinent Vitals/Pain Pain Assessment: 0-10 Pain Score: 5  (4-5 ) Pain Location: right knee  Pain Descriptors / Indicators: Sore Pain Intervention(s): Monitored  during session;Ice applied (polar care reapplied after PT session )    Home Living                      Prior Function            PT Goals (current goals can now be found in the care plan section) Acute Rehab PT Goals Patient Stated Goal: to go home  PT Goal Formulation: With patient Time For Goal Achievement: 07/02/20 Potential to Achieve Goals: Good Progress towards PT goals: Progressing toward goals    Frequency    BID      PT Plan Current plan remains appropriate    Co-evaluation              AM-PAC PT "6 Clicks" Mobility   Outcome Measure  Help needed turning from your back to your side while in a flat bed without using bedrails?: A Little Help needed moving from lying on your back to sitting on the side of a flat bed without using bedrails?: A Little Help needed moving to and from a bed to a chair (including a wheelchair)?: A Little Help needed standing up from a chair using your arms (e.g., wheelchair or bedside chair)?: A Little Help needed to walk in hospital room?: A Little Help needed climbing 3-5 steps with a railing? : A Little 6 Click Score: 18    End of Session Equipment Utilized During Treatment: Gait belt Activity Tolerance: Patient tolerated treatment well Patient left: in bed;with call bell/phone within reach;with bed alarm set (polar care reapplied right knee ) Nurse Communication: Mobility status PT Visit Diagnosis: Other abnormalities of gait and mobility (R26.89);Difficulty in walking, not elsewhere classified (R26.2);Pain Pain - Right/Left: Right Pain - part of body: Knee     Time: 8937-3428 PT Time Calculation (min) (ACUTE ONLY): 23 min  Charges:  $Gait Training: 23-37 mins                     Minna Merritts, PT, MPT    Brian Howell 06/18/2020, 3:57 PM

## 2020-06-18 NOTE — Progress Notes (Signed)
Inpatient Diabetes Program Recommendations  AACE/ADA: New Consensus Statement on Inpatient Glycemic Control (2015)  Target Ranges:  Prepandial:   less than 140 mg/dL      Peak postprandial:   less than 180 mg/dL (1-2 hours)      Critically ill patients:  140 - 180 mg/dL   Lab Results  Component Value Date   GLUCAP 340 (H) 06/18/2020    Review of Glycemic Control Results for Brian Howell, Brian Howell (MRN [id number]) as of 06/18/2020 11:05  Ref. Range 06/17/2020 10:11 06/17/2020 16:03 06/17/2020 18:14 06/17/2020 21:17 06/18/2020 05:57 06/18/2020 08:00  Glucose-Capillary Latest Ref Range: 70 - 99 mg/dL 183 (H) 241 (H) 285 (H) 404 (H) 316 (H) 340 (H)   Diabetes history: DM 2 Outpatient Diabetes medications:  NPH 75 units qam, 35 units qpm, regular 55 units breakfast, 35 units lunch, 55 units dinner, Metformin 1000 mg bid Current orders for Inpatient glycemic control:  Levemir 40 units bid, metformin 1000 mg bid, Novolog 0-15 units 4x/day, Novolog 44 units breakfast,. 28 units lunch, 44 units dinner  Decadron 8 mg given yesterday  Inpatient Diabetes Program Recommendations:    Pt may d/c today. If pt remains in the hospital consider increasing Levemir to 50 units bid.  Note pt received decadron yesterday. Glucose trends should decrease within the next 24-48 hours. Will need to check glucose frequently at home in the next 4 days to make sure trends are coming down to baseline. There is not an A1c on file to determine baseline glucose control.  Thanks,  Tama Headings RN, MSN, BC-ADM Inpatient Diabetes Coordinator Team Pager [phone] (8a-5p)

## 2020-06-19 LAB — GLUCOSE, CAPILLARY: Glucose-Capillary: 134 mg/dL — ABNORMAL HIGH (ref 70–99)

## 2020-06-19 MED ORDER — OXYCODONE HCL 5 MG PO TABS: 5.0000 mg | ORAL_TABLET | ORAL | 0 refills | Status: AC | PRN

## 2020-06-19 MED ORDER — CELECOXIB 200 MG PO CAPS: 200.0000 mg | ORAL_CAPSULE | Freq: Two times a day (BID) | ORAL | 0 refills | Status: DC

## 2020-06-19 MED ORDER — ENOXAPARIN SODIUM 40 MG/0.4ML ~~LOC~~ SOLN: 40.0000 mg | SUBCUTANEOUS | 0 refills | Status: AC

## 2020-06-19 NOTE — Discharge Summary (Signed)
Physician Discharge Summary  Patient ID: Brian Howell MRN: [id number] DOB/AGE: 21-Feb-1971 49 y.o.  Admit date: 06/17/2020 Discharge date: 06/19/2020  Admission Diagnoses:  Total knee replacement status [Z96.659]  Surgeries:Procedure(s): Right total knee arthroplasty using computer-assisted navigation  SURGEON:  Marciano Sequin. M.D.  ASSISTANT: Cassell Smiles, PA-C (present and scrubbed throughout the case, critical for assistance with exposure, retraction, instrumentation, and closure)  ANESTHESIA: spinal  ESTIMATED BLOOD LOSS: 100 mL  FLUIDS REPLACED: 1200 mL of crystalloid  TOURNIQUET TIME: 108 minutes  DRAINS: 2 medium Hemovac drains  SOFT TISSUE RELEASES: Anterior cruciate ligament, posterior cruciate ligament, deep medial collateral ligament, patellofemoral ligament  IMPLANTS UTILIZED: DePuy Attune size 5 posterior stabilized femoral component (cemented), size 6 rotating platform tibial component (cemented), 41 mm medialized dome patella (cemented), and a 5 mm stabilized rotating platform polyethylene insert.  Discharge Diagnoses: Patient Active Problem List   Diagnosis Date Noted  . Total knee replacement status 06/17/2020  . Erectile dysfunction due to arterial insufficiency 09/06/2019  . Hyperlipoproteinemia 01/08/2016  . Morbid obesity ([place]) 11/06/2015  . Gonalgia 10/01/2015  . Disassociation disorder 09/06/2015  . BP (high blood pressure) 09/06/2015  . Generalized OA 09/06/2015  . Chronic pain associated with significant psychosocial dysfunction 09/06/2015  . Myocarditis, viral 10/25/2014  . Aphthae 10/25/2014  . Chest pain 10/18/2014  . Elevated troponin I level 10/18/2014  . Adult BMI 30+ 06/26/2014  . Infected cuticle 02/28/2014  . Other disorders of nervous system 03/31/2013  . Diabetic neuropathy ([place]) 12/28/2012  . Cephalalgia 12/28/2012  . LBP (low back pain) 12/28/2012  . Adjustment disorder with anxiety 12/20/2012  . Cervical pain  05/20/2011  . Amnesia 03/06/2011  . Obstructive apnea 11/14/2010  . Cervical disc disease 11/07/2010  . Brachial neuritis 10/27/2010  . Breath shortness 08/29/2009  . Type 2 diabetes mellitus ([place]) 08/29/2009  . Benign hypertension 12/14/2008  . Combined fat and carbohydrate induced hyperlipemia 12/07/2008    Past Medical History:  Diagnosis Date  . Anxiety   . Arthritis   . Diabetes mellitus, type II ([place])   . Headache   . Hypertension   . Memory deficit   . Neuropathy   . Sleep apnea    cpap     Transfusion:    Consultants (if any):   Discharged Condition: Improved  Hospital Course: Brian Howell is an 49 y.o. male who was admitted 06/17/2020 with a diagnosis of right knee osteoarthritis and went to the operating room on 06/17/2020 and underwent right total knee arthroplasty. The patient received perioperative antibiotics for prophylaxis (see below). The patient tolerated the procedure well and was transported to PACU in stable condition. After meeting PACU criteria, the patient was subsequently transferred to the Orthopaedics/Rehabilitation unit.   The patient received DVT prophylaxis in the form of early mobilization, Lovenox, Foot Pumps and TED hose. A sacral pad had been placed and heels were elevated off of the bed with rolled towels in order to protect skin integrity. Foley catheter was discontinued on postoperative day #0. Wound drains were discontinued on postoperative day #2. The surgical incision was healing well without signs of infection.  Physical therapy was initiated postoperatively for transfers, gait training, and strengthening. Occupational therapy was initiated for activities of daily living and evaluation for assisted devices. Rehabilitation goals were reviewed in detail with the patient. The patient made steady progress with physical therapy and physical therapy recommended discharge to Home.   The patient achieved the preliminary goals of this  hospitalization and was felt  to be medically and orthopaedically appropriate for discharge.  He was given perioperative antibiotics:  Anti-infectives (From admission, onward)   Start     Dose/Rate Route Frequency Ordered Stop   06/17/20 1800  ceFAZolin (ANCEF) IVPB 2g/100 mL premix        2 g 200 mL/hr over 30 Minutes Intravenous Every 6 hours 06/17/20 1751 06/18/20 0559   06/17/20 1051  ceFAZolin (ANCEF) 2-4 GM/100ML-% IVPB       Note to Pharmacy: Brian Howell   : cabinet override      06/17/20 1051 06/17/20 2259   06/17/20 0600  ceFAZolin (ANCEF) IVPB 2g/100 mL premix  Status:  Discontinued        2 g 200 mL/hr over 30 Minutes Intravenous On call to O.R. 06/17/20 0001 06/17/20 1020    .  Recent vital signs:  Vitals:   06/18/20 1722 06/19/20 0051  BP: (!) 116/51 118/62  Pulse: 79 74  Resp: 12 16  Temp: 98 F (36.7 C) 97.8 F (36.6 C)  SpO2: 95% 97%    Recent laboratory studies:  No results for input(s): WBC, HGB, HCT, PLT, K, CL, CO2, BUN, CREATININE, GLUCOSE, CALCIUM, LABPT, INR in the last 72 hours.  Diagnostic Studies: CT HEAD WO CONTRAST  Result Date: 06/07/2020 CLINICAL DATA:  Headache and blurred vision. Dizziness. Recent fall EXAM: CT HEAD WITHOUT CONTRAST TECHNIQUE: Contiguous axial images were obtained from the base of the skull through the vertex without intravenous contrast. COMPARISON:  Brain MRI December 21, 2017 FINDINGS: Brain: Ventricles and sulci are normal in size and configuration. There is no intracranial mass, hemorrhage, extra-axial fluid collection, or midline shift. The brain parenchyma appears unremarkable. No demonstrable acute infarct. Vascular: No hyperdense vessel.  No evident vascular calcification. Skull: The bony calvarium appears intact. Sinuses/Orbits: There is mucosal thickening in several ethmoid air cells. Other visualized paranasal sinuses are clear. Orbits appear symmetric bilaterally. Other: Mastoid air cells are clear. IMPRESSION: Normal  appearing brain parenchyma. No demonstrable acute infarct. No mass or hemorrhage. Mucosal thickening noted in several ethmoid air cells. Electronically Signed   By: Brian Howell M.D.   On: 06/07/2020 09:53   DG Knee Right Port  Result Date: 06/17/2020 CLINICAL DATA:  Postop EXAM: PORTABLE RIGHT KNEE - 1-2 VIEW COMPARISON:  MRI 11/17/2019 FINDINGS: Status post right knee replacement with intact hardware and normal alignment. Drain in the suprapatellar soft tissues. Gas in the soft tissues consistent with recent surgery. IMPRESSION: Status post right knee replacement with expected postsurgical changes. Electronically Signed   By: Donavan Foil M.D.   On: 06/17/2020 16:20    Discharge Medications:   Allergies as of 06/19/2020      Reactions   Ace Inhibitors Swelling   Lips and face   Gabapentin Rash      Medication List    STOP taking these medications   aspirin 81 MG chewable tablet   ibuprofen 800 MG tablet Commonly known as: ADVIL     TAKE these medications   acetaminophen 500 MG tablet Commonly known as: TYLENOL Take 1,000 mg by mouth every 6 (six) hours as needed for moderate pain or headache.   B-12 Compliance Injection 1000 MCG/ML Kit Generic drug: Cyanocobalamin Inject 1,000 mcg as directed every 30 (thirty) days.   celecoxib 200 MG capsule Commonly known as: CELEBREX Take 1 capsule (200 mg total) by mouth 2 (two) times daily.   clonazePAM 0.5 MG tablet Commonly known as: KLONOPIN Take 0.5 mg by mouth at bedtime.  DULoxetine 60 MG capsule Commonly known as: CYMBALTA Take 60 mg by mouth 2 (two) times daily.   enoxaparin 40 MG/0.4ML injection Commonly known as: LOVENOX Inject 0.4 mLs (40 mg total) into the skin daily for 14 days.   FreeStyle InsuLinx Test test strip Generic drug: glucose blood   furosemide 20 MG tablet Commonly known as: LASIX Take 20 mg by mouth daily as needed for fluid or edema.   glucose 4 GM chewable tablet Chew 8-12 g by  mouth as needed for low blood sugar.   HumuLIN N 100 UNIT/ML injection Generic drug: insulin NPH Human Inject 35-75 Units into the skin See admin instructions. Inject 75 units in the morning and 35 units at night   HumuLIN R 100 units/mL injection Generic drug: insulin regular Inject 35-55 Units into the skin See admin instructions. Inject 55 units with breakfast, 35 units at lunch time, and 55 units with dinner  Sliding scale   hydrOXYzine 25 MG tablet Commonly known as: ATARAX/VISTARIL Take 25 mg by mouth 3 (three) times daily.   INSULIN SYRINGE .3CC/29GX1/2" 29G X 1/2" 0.3 ML Misc   losartan 25 MG tablet Commonly known as: COZAAR Take 25 mg by mouth daily.   LUBRICATING EYE DROPS OP Place 1 drop into both eyes daily as needed (dry eyes).   Melatonin 10 MG Caps Take 10 mg by mouth at bedtime.   metFORMIN 1000 MG tablet Commonly known as: GLUCOPHAGE Take 1,000 mg by mouth 2 (two) times daily.   OneTouch Delica Lancets Fine Misc   Oxycodone HCl 10 MG Tabs Take 10 mg by mouth 3 (three) times daily. What changed: Another medication with the same name was added. Make sure you understand how and when to take each.   oxyCODONE 5 MG immediate release tablet Commonly known as: Oxy IR/ROXICODONE Take 1 tablet (5 mg total) by mouth every 4 (four) hours as needed for moderate pain (pain score 4-6). What changed: You were already taking a medication with the same name, and this prescription was added. Make sure you understand how and when to take each.   simvastatin 20 MG tablet Commonly known as: ZOCOR Take 20 mg by mouth at bedtime.   tiZANidine 4 MG tablet Commonly known as: ZANAFLEX Take 4 mg by mouth every 8 (eight) hours as needed for muscle spasms.   traZODone 100 MG tablet Commonly known as: DESYREL TAKE 1 TABLET BY MOUTH AT BEDTIME AS NEEDED FOR SLEEP            Durable Medical Equipment  (From admission, onward)         Start     Ordered   06/17/20 1752   DME Walker rolling  Once       Question:  Patient needs a walker to treat with the following condition  Answer:  Total knee replacement status   06/17/20 1751   06/17/20 1752  DME Bedside commode  Once       Question:  Patient needs a bedside commode to treat with the following condition  Answer:  Total knee replacement status   06/17/20 1751          Disposition: home with home health PT and OT      Follow-up Information    [facility] On 07/02/2020.   Specialty: Orthopedic Surgery Why: at 10:45am Contact information: [place] [address] [phone]        Skip Estimable  P, MD On 07/26/2020.   Specialty: Orthopedic Surgery Why: at 3:15pm Contact information: [place] [address] [place], PA-C 06/19/2020, 7:12 AM

## 2020-06-19 NOTE — Progress Notes (Signed)
Physical Therapy Treatment Patient Details Name: Brian Howell MRN: [id number] DOB: [birth year] Today's Date: 06/19/2020    History of Present Illness Patient is a 49 y.o. male  with degenerative arthrosis of the right knee s/p right total knee arthroplasty. Past medical history of besity, diabetes, arthritis, anxiety, memory deficit, neuropathy, sleep apnea, right knee arthroscopy    PT Comments    Pt was supine in bed upon arriving. He is A and O x 4 and agreeable to PT session. Pt easily able to exit bed without assistance. Stood to [employer] and ambulated 200 ft. Easily and safely able to perform stairs without safety concerns. Pt states confidence in safely [place] home this afternoon with HHPT to follow. Pt was able to perform AROM 2-92 degrees. Will benefit from HHPT at DC to continue to progress ROM, strength, and safe functional mobility. At conclusion of session, pt was in recliner with call bell in reach, polar care reapplied and towel roll placed under RLE to promote ext. RN aware of pt's abilities.     Follow Up Recommendations  Home health PT     Equipment Recommendations  Rolling walker with 5" wheels;3in1 (PT)    Recommendations for Other Services       Precautions / Restrictions Precautions Precautions: Fall;Knee Restrictions Weight Bearing Restrictions: Yes RLE Weight Bearing: Weight bearing as tolerated    Mobility  Bed Mobility Overal bed mobility: Modified Independent Bed Mobility: Supine to Sit     Supine to sit: Modified independent (Device/Increase time)     General bed mobility comments: Pt required no assistance to exit bed   Transfers Overall transfer level: Modified independent Equipment used: Rolling walker (2 wheeled) Transfers: Sit to/from Stand Sit to Stand: Supervision            Ambulation/Gait Ambulation/Gait assistance: Supervision Gait Distance (Feet): 200 Feet Assistive device: Rolling walker (2 wheeled) Gait Pattern/deviations:  Step-through pattern;Antalgic Gait velocity: decreased    General Gait Details: Pt was able to ambulate 200 ft with RW without LOB or difficulty   Stairs Stairs: Yes Stairs assistance: Min guard;Supervision Stair Management: One rail Left;Step to pattern Number of Stairs: 4 General stair comments: pt was easily able to safely perform ascending/descending stairs with step to pattern. noLOB or difficulty       Balance Overall balance assessment: Modified Independent Sitting-balance support: No upper extremity supported Sitting balance-Leahy Scale: Good Sitting balance - Comments: no LOB in sitting   Standing balance support: Bilateral upper extremity supported;During functional activity Standing balance-Leahy Scale: Good Standing balance comment: UE support on RW. is able to static stand without UIE support         Cognition Arousal/Alertness: Awake/alert Behavior During Therapy: WFL for tasks assessed/performed Overall Cognitive Status: Within Functional Limits for tasks assessed      General Comments: Pt is A and O x 4. cooperative and pleasant throughout.              Pertinent Vitals/Pain Pain Assessment: 0-10 Pain Score: 6  Pain Location: right knee  Pain Descriptors / Indicators: Discomfort;Sore Pain Intervention(s): Limited activity within patient's tolerance;Monitored during session;Repositioned;Ice applied           PT Goals (current goals can now be found in the care plan section) Acute Rehab PT Goals Patient Stated Goal: to go home  Progress towards PT goals: Progressing toward goals    Frequency    BID      PT Plan Current plan remains appropriate  AM-PAC PT "6 Clicks" Mobility   Outcome Measure  Help needed turning from your back to your side while in a flat bed without using bedrails?: A Little Help needed moving from lying on your back to sitting on the side of a flat bed without using bedrails?: A Little Help needed moving to  and from a bed to a chair (including a wheelchair)?: A Little Help needed standing up from a chair using your arms (e.g., wheelchair or bedside chair)?: A Little Help needed to walk in hospital room?: A Little Help needed climbing 3-5 steps with a railing? : A Little 6 Click Score: 18    End of Session Equipment Utilized During Treatment: Gait belt Activity Tolerance: Patient tolerated treatment well Patient left: in chair;with call bell/phone within reach;with chair alarm set Nurse Communication: Mobility status PT Visit Diagnosis: Other abnormalities of gait and mobility (R26.89);Difficulty in walking, not elsewhere classified (R26.2);Pain Pain - Right/Left: Right Pain - part of body: Knee     Time: 0820-0845 PT Time Calculation (min) (ACUTE ONLY): 25 min  Charges:  $Gait Training: 23-37 mins                     Julaine Fusi PTA 06/19/20, 9:01 AM

## 2020-06-19 NOTE — Progress Notes (Signed)
Discharge instructions reviewed with pt. Pt verbalized understanding of instructions. Two honeycomb dressings given to pt. Belongings returned and packet given. Pt educated on giving lovenox injections. IV removed. Pt escorted out by nurse tech.

## 2020-06-19 NOTE — Plan of Care (Signed)
  Problem: Education: Goal: Knowledge of General Education information will improve Description: Including pain rating scale, medication(s)/side effects and non-pharmacologic comfort measures Outcome: Progressing   Problem: Pain Managment: Goal: General experience of comfort will improve Outcome: Progressing   Problem: Safety: Goal: Ability to remain free from injury will improve Outcome: Progressing   Problem: Skin Integrity: Goal: Risk for impaired skin integrity will decrease Outcome: Progressing   Problem: Education: Goal: Knowledge of the prescribed therapeutic regimen will improve Outcome: Progressing Goal: Individualized Educational Video(s) Outcome: Progressing   Problem: Activity: Goal: Ability to avoid complications of mobility impairment will improve Outcome: Progressing Goal: Range of joint motion will improve Outcome: Progressing   Problem: Clinical Measurements: Goal: Postoperative complications will be avoided or minimized Outcome: Progressing   Problem: Pain Management: Goal: Pain level will decrease with appropriate interventions Outcome: Progressing   Problem: Skin Integrity: Goal: Will show signs of wound healing Outcome: Progressing

## 2020-06-19 NOTE — Progress Notes (Signed)
  Subjective: 2 Days Post-Op Procedure(s) (LRB): COMPUTER ASSISTED TOTAL KNEE ARTHROPLASTY (Right) Patient reports pain as well-controlled.   Patient is well, and has had no acute complaints or problems Plan is to go Home after hospital stay. Negative for chest pain and shortness of breath Fever: no Gastrointestinal: negative for nausea and vomiting.  Patient has had a bowel movement.  Objective: Vital signs in last 24 hours: Temp:  [97.8 F (36.6 C)-99 F (37.2 C)] 97.8 F (36.6 C) (10/27 0051) Pulse Rate:  [74-87] 74 (10/27 0051) Resp:  [12-18] 16 (10/27 0051) BP: (111-143)/(50-62) 118/62 (10/27 0051) SpO2:  [94 %-97 %] 97 % (10/27 0051)  Intake/Output from previous day:  Intake/Output Summary (Last 24 hours) at 06/19/2020 0707 Last data filed at 06/19/2020 0614 Gross per 24 hour  Intake 1823.32 ml  Output 2080 ml  Net -256.68 ml    Intake/Output this shift: No intake/output data recorded.  Labs: No results for input(s): HGB in the last 72 hours. No results for input(s): WBC, RBC, HCT, PLT in the last 72 hours. No results for input(s): NA, K, CL, CO2, BUN, CREATININE, GLUCOSE, CALCIUM in the last 72 hours. No results for input(s): LABPT, INR in the last 72 hours.   EXAM General - Patient is Alert, Appropriate and Oriented Extremity - Neurovascular intact Dorsiflexion/Plantar flexion intact Compartment soft Dressing/Incision -Postoperative dressing remains in place., Polar Care in place and working. , Hemovac in place. , Following post-op dressing removal, minimal sanguinous drainage noted Motor Function - intact, moving foot and toes well on exam.  Cardiovascular- Regular rate and rhythm, no murmurs/rubs/gallops Respiratory- Lungs clear to auscultation bilaterally Gastrointestinal- soft and nontender   Assessment/Plan: 2 Days Post-Op Procedure(s) (LRB): COMPUTER ASSISTED TOTAL KNEE ARTHROPLASTY (Right) Active Problems:   Total knee replacement  status  Estimated body mass index is 39.05 kg/m as calculated from the following:   Height as of this encounter: 5\' 11"  (1.803 m).   Weight as of this encounter: 127 kg. Advance diet Up with therapy Discharge home with home health  Hemovac removed, mini compression dressing applied. TED hose applied.  DVT Prophylaxis - Lovenox, Ted hose and foot pumps Weight-Bearing as tolerated to right leg  Cassell Smiles, PA-C [facility] 06/19/2020, 7:07 AM

## 2020-06-19 NOTE — Progress Notes (Signed)
Inpatient Diabetes Program Recommendations  AACE/ADA: New Consensus Statement on Inpatient Glycemic Control (2015)  Target Ranges:  Prepandial:   less than 140 mg/dL      Peak postprandial:   less than 180 mg/dL (1-2 hours)      Critically ill patients:  140 - 180 mg/dL   Lab Results  Component Value Date   GLUCAP 134 (H) 06/19/2020    Review of Glycemic Control   Diabetes history: DM 2 Outpatient Diabetes medications:  NPH 75 units qam, 35 units qpm, regular 55 units breakfast, 35 units lunch, 55 units dinner, Metformin 1000 mg bid Current orders for Inpatient glycemic control:  Levemir 40 units bid, metformin 1000 mg bid, Novolog 0-15 units 4x/day, Novolog 44 units breakfast,. 28 units lunch, 44 units dinner  Decadron 8 mg given yesterday  Inpatient Diabetes Program Recommendations:    Glucose 134 this am no changes  -  Pt will need to check glucose frequently at home in the next 4 days to make sure trends are coming down to baseline. There is not an A1c on file to determine baseline glucose control.  Thanks,  Tama Headings RN, MSN, BC-ADM Inpatient Diabetes Coordinator Team Pager [phone] (8a-5p)

## 2020-06-19 NOTE — Plan of Care (Signed)
  Problem: Education: Goal: Knowledge of General Education information will improve Description: Including pain rating scale, medication(s)/side effects and non-pharmacologic comfort measures 06/19/2020 1147 by Orvan Seen, RN Outcome: Completed/Met 06/19/2020 1146 by Orvan Seen, RN Outcome: Progressing   Problem: Pain Managment: Goal: General experience of comfort will improve 06/19/2020 1147 by Orvan Seen, RN Outcome: Completed/Met 06/19/2020 1146 by Orvan Seen, RN Outcome: Progressing   Problem: Safety: Goal: Ability to remain free from injury will improve 06/19/2020 1147 by Orvan Seen, RN Outcome: Completed/Met 06/19/2020 1146 by Orvan Seen, RN Outcome: Progressing   Problem: Skin Integrity: Goal: Risk for impaired skin integrity will decrease 06/19/2020 1147 by Orvan Seen, RN Outcome: Completed/Met 06/19/2020 1146 by Orvan Seen, RN Outcome: Progressing   Problem: Education: Goal: Knowledge of the prescribed therapeutic regimen will improve 06/19/2020 1147 by Orvan Seen, RN Outcome: Completed/Met 06/19/2020 1146 by Orvan Seen, RN Outcome: Progressing Goal: Individualized Educational Video(s) 06/19/2020 1147 by Orvan Seen, RN Outcome: Completed/Met 06/19/2020 1146 by Orvan Seen, RN Outcome: Progressing   Problem: Activity: Goal: Ability to avoid complications of mobility impairment will improve 06/19/2020 1147 by Orvan Seen, RN Outcome: Completed/Met 06/19/2020 1146 by Orvan Seen, RN Outcome: Progressing Goal: Range of joint motion will improve 06/19/2020 1147 by Orvan Seen, RN Outcome: Completed/Met 06/19/2020 1146 by Orvan Seen, RN Outcome: Progressing   Problem: Clinical Measurements: Goal: Postoperative complications will be avoided or minimized 06/19/2020 1147 by Orvan Seen, RN Outcome: Completed/Met 06/19/2020 1146 by Orvan Seen, RN Outcome:  Progressing   Problem: Pain Management: Goal: Pain level will decrease with appropriate interventions 06/19/2020 1147 by Orvan Seen, RN Outcome: Completed/Met 06/19/2020 1146 by Orvan Seen, RN Outcome: Progressing   Problem: Skin Integrity: Goal: Will show signs of wound healing 06/19/2020 1147 by Orvan Seen, RN Outcome: Completed/Met 06/19/2020 1146 by Orvan Seen, RN Outcome: Progressing

## 2020-10-16 ENCOUNTER — Other Ambulatory Visit: Payer: Self-pay

## 2020-10-16 ENCOUNTER — Ambulatory Visit: Payer: Medicare Other | Admitting: Dermatology

## 2020-10-16 DIAGNOSIS — D492 Neoplasm of unspecified behavior of bone, soft tissue, and skin: Secondary | ICD-10-CM

## 2020-10-16 DIAGNOSIS — D1800 Hemangioma unspecified site: Secondary | ICD-10-CM

## 2020-10-16 DIAGNOSIS — L570 Actinic keratosis: Secondary | ICD-10-CM

## 2020-10-16 DIAGNOSIS — L83 Acanthosis nigricans: Secondary | ICD-10-CM | POA: Diagnosis not present

## 2020-10-16 DIAGNOSIS — D2272 Melanocytic nevi of left lower limb, including hip: Secondary | ICD-10-CM

## 2020-10-16 DIAGNOSIS — L219 Seborrheic dermatitis, unspecified: Secondary | ICD-10-CM

## 2020-10-16 DIAGNOSIS — D229 Melanocytic nevi, unspecified: Secondary | ICD-10-CM

## 2020-10-16 DIAGNOSIS — L739 Follicular disorder, unspecified: Secondary | ICD-10-CM

## 2020-10-16 DIAGNOSIS — L820 Inflamed seborrheic keratosis: Secondary | ICD-10-CM

## 2020-10-16 DIAGNOSIS — L821 Other seborrheic keratosis: Secondary | ICD-10-CM

## 2020-10-16 DIAGNOSIS — Z1283 Encounter for screening for malignant neoplasm of skin: Secondary | ICD-10-CM | POA: Diagnosis not present

## 2020-10-16 DIAGNOSIS — L578 Other skin changes due to chronic exposure to nonionizing radiation: Secondary | ICD-10-CM

## 2020-10-16 DIAGNOSIS — L814 Other melanin hyperpigmentation: Secondary | ICD-10-CM

## 2020-10-16 DIAGNOSIS — D239 Other benign neoplasm of skin, unspecified: Secondary | ICD-10-CM

## 2020-10-16 HISTORY — DX: Other benign neoplasm of skin, unspecified: D23.9

## 2020-10-16 MED ORDER — KETOCONAZOLE 2 % EX SHAM: 1.0000 "application " | MEDICATED_SHAMPOO | CUTANEOUS | 3 refills | Status: AC

## 2020-10-16 MED ORDER — TEXACORT 2.5 % EX SOLN: 1.0000 "application " | CUTANEOUS | 3 refills | Status: AC

## 2020-10-16 NOTE — Patient Instructions (Addendum)
Wound Care Instructions  1. Cleanse wound gently with soap and water once a day then pat dry with clean gauze. Apply a thing coat of Petrolatum (petroleum jelly, "Vaseline") over the wound (unless you have an allergy to this). We recommend that you use a new, sterile tube of Vaseline. Do not pick or remove scabs. Do not remove the yellow or white "healing tissue" from the base of the wound.  2. Cover the wound with fresh, clean, nonstick gauze and secure with paper tape. You may use Band-Aids in place of gauze and tape if the would is small enough, but would recommend trimming much of the tape off as there is often too much. Sometimes Band-Aids can irritate the skin.  3. You should call the office for your biopsy report after 1 week if you have not already been contacted.  4. If you experience any problems, such as abnormal amounts of bleeding, swelling, significant bruising, significant pain, or evidence of infection, please call the office immediately.  5. FOR ADULT SURGERY PATIENTS: If you need something for pain relief you may take 1 extra strength Tylenol (acetaminophen) AND 2 Ibuprofen (200mg  each) together every 4 hours as needed for pain. (do not take these if you are allergic to them or if you have a reason you should not take them.) Typically, you may only need pain medication for 1 to 3 days.     Instructions for [facility] Medications  One or more of your medications was sent to the [facility] mail order compounding pharmacy. You will receive an email from them and can purchase the medicine through that link. It will then be mailed to your home at the address you confirmed. If for any reason you do not receive an email from them, please check your spam folder. If you still do not find the email, please let us know. [facility] phone number is [phone].   Actinic Keratosis  What is an actinic keratosis? An actinic keratosis (plural: actinic keratoses) is growth on  the surface of the skin that usually appears as a red, hard, crusty or scaly bump.  What causes actinic keratoses? Repeated prolonged sun exposure causes skin damage, especially in fair-skinned persons. Sun-damaged skin becomes dry and wrinkled and may form rough, scaly spots called actinic keratoses. These rough spots remain on the skin even though the crust or scale on top is picked off.  Why treat actinic keratoses? Actinic keratoses are not skin cancers, but because they may sometimes turn cancerous they are called "pre-cancerous". Not all will turn to skin cancer, and it usually takes several years for this to happen. Because it is much easier to treat an actinic keratosis then it is to remove a skin cancer, actinic keratoses should be treated to prevent future skin cancer.  How are actinic keratoses treated? The most common way of treating actinic keratoses is to freeze them with liquid nitrogen. Freezing causes scabbing and shedding of the sun-damaged skin. Healing after a removal usually takes two weeks, depending on the size and location of the keratosis. Hands and legs heal more slowly than the face. The skin's final appearance is usually excellent. There are several topical medications that can be used to treat actinic keratoses. These medications generally have side effects of redness, crusting, and pain.Some are used for a few days, and some for several months before the actinic keratosis is completely gone. Photodynamic therapy is another alternative to freezing actinic keratoses.This treatment is done in a physician's  office.A medication is applied to the area of skin with actinic keratoses, and it is allowed to soak in for one or more hours. A special light is then applied to the skin.Side effects include redness, burning, and peeling.  How can you prevent actinic keratoses? Protection from the sun is the best way to prevent actinic keratoses.The use of proper clothing and  sunscreens can prevent the sun damage that leads to an actinic keratosis. Unfortunately, some sun damage is permanent. Once sun damage has progressed to the point where actinic keratoses develop, new keratoses may appear even without further sun exposure. However, even in skin that is already heavily sun damaged, good sun protection can help reduce the number of actinic keratoses that will appear.

## 2020-10-16 NOTE — Progress Notes (Signed)
New Patient Visit  Subjective  Brian Howell is a 50 y.o. male who presents for the following: Totaly body skin exam (No hx of skin ca, no Fhx of skin ca). New patient referral from Dr. Fulton Reek. The patient presents for Total-Body Skin Exam (TBSE) for skin cancer screening and mole check.  Patient accompanied by wife who contributes to history.  The following portions of the chart were reviewed this encounter and updated as appropriate:   Tobacco  Allergies  Meds  Problems  Med Hx  Surg Hx  Fam Hx     Review of Systems:  No other skin or systemic complaints except as noted in HPI or Assessment and Plan.  Objective  Well appearing patient in no apparent distress; mood and affect are within normal limits.  A full examination was performed including scalp, head, eyes, ears, nose, lips, neck, chest, axillae, abdomen, back, buttocks, bilateral upper extremities, bilateral lower extremities, hands, feet, fingers, toes, fingernails, and toenails. All findings within normal limits unless otherwise noted below.  Objective  Scalp x 2 (2): Pink scaly macules   Objective  Scalp: Excoriated paps scalp  Objective  beard area, scalp, axillary area: Pink patches with greasy scale.   Images      Objective  posterior neck: Dark verrucous patch post neck  Objective  back x 17, R areola x 1 (18): Erythematous keratotic or waxy stuck-on papule or plaque.   Objective  L post thigh: Irregular brown macule    Assessment & Plan    Lentigines - Scattered tan macules - Due to sun exposure - Benign-appering, observe - Recommend daily broad spectrum sunscreen SPF 30+ to sun-exposed areas, reapply every 2 hours as needed. - Call for any changes  Seborrheic Keratoses - Stuck-on, waxy, tan-brown papules and plaques  - Discussed benign etiology and prognosis. - Observe - Call for any changes  Melanocytic Nevi - Tan-brown and/or pink-flesh-colored symmetric  macules and papules - Benign appearing on exam today - Observation - Call clinic for new or changing moles - Recommend daily use of broad spectrum spf 30+ sunscreen to sun-exposed areas.   Hemangiomas - Red papules - Discussed benign nature - Observe - Call for any changes  Actinic Damage - Chronic, secondary to cumulative UV/sun exposure - diffuse scaly erythematous macules with underlying dyspigmentation - Recommend daily broad spectrum sunscreen SPF 30+ to sun-exposed areas, reapply every 2 hours as needed.  - Call for new or changing lesions.  Skin cancer screening performed today.  AK (actinic keratosis) (2) Scalp x 2  Destruction of lesion - Scalp x 2 Complexity: simple   Destruction method: cryotherapy   Informed consent: discussed and consent obtained   Timeout:  patient name, date of birth, surgical site, and procedure verified Lesion destroyed using liquid nitrogen: Yes   Region frozen until ice ball extended beyond lesion: Yes   Outcome: patient tolerated procedure well with no complications   Post-procedure details: wound care instructions given    Folliculitis Scalp Adora Fridge Start [facility] Triple cream qhs  Rosacea is a chronic progressive skin condition usually affecting the face of adults, causing redness and/or acne bumps. It is treatable but not curable. It sometimes affects the eyes (ocular rosacea) as well. It may respond to topical and/or systemic medication and can flare with stress, sun exposure, alcohol, exercise and some foods.  Daily application of broad spectrum spf 30+ sunscreen to face is recommended to reduce flares.   Seborrheic dermatitis beard area, scalp, axillary  area Start [place] solution 3d/wk to beard area Start Ketoconazole 2% shampoo 3d/wk to scalp, beard area, axillary area  May add [facility] Intertrigo cream in future to axillary area  Seborrheic Dermatitis  -  is a chronic persistent rash characterized by  pinkness and scaling most commonly of the mid face but also can occur on the scalp (dandruff), ears; mid chest and mid back. It tends to be exacerbated by stress and cooler weather.  People who have neurologic disease may experience new onset or exacerbation of existing seborrheic dermatitis.  The condition is not curable but treatable and can be controlled.  HYDROCORTISONE, TOPICAL, (TEXACORT) 2.5 % SOLN - beard area, scalp, axillary area  ketoconazole (NIZORAL) 2 % shampoo - beard area, scalp, axillary area  Acanthosis nigricans posterior neck Chronic; persistent.   Benign, observe Will further discuss on f/u and discuss treatment options then.  Inflamed seborrheic keratosis (18) back x 17, R areola x 1  Destruction of lesion - back x 17, R areola x 1 Complexity: simple   Destruction method: cryotherapy   Informed consent: discussed and consent obtained   Timeout:  patient name, date of birth, surgical site, and procedure verified Lesion destroyed using liquid nitrogen: Yes   Region frozen until ice ball extended beyond lesion: Yes   Outcome: patient tolerated procedure well with no complications   Post-procedure details: wound care instructions given    Neoplasm of skin L post thigh  Epidermal / dermal shaving  Lesion diameter (cm):  0.6 Informed consent: discussed and consent obtained   Timeout: patient name, date of birth, surgical site, and procedure verified   Procedure prep:  Patient was prepped and draped in usual sterile fashion Prep type:  Isopropyl alcohol Anesthesia: the lesion was anesthetized in a standard fashion   Anesthetic:  1% lidocaine w/ epinephrine 1-100,000 buffered w/ 8.4% NaHCO3 Instrument used: flexible razor blade   Hemostasis achieved with: pressure, aluminum chloride and electrodesiccation   Outcome: patient tolerated procedure well   Post-procedure details: sterile dressing applied and wound care instructions given   Dressing type: bandage and  petrolatum    Specimen 1 - Surgical pathology Differential Diagnosis: D48.5 Nevus vs Dysplastic nevus  Check Margins: yes Irregular brown macule 0.6cm  Return in about 2 months (around 12/14/2020) for f/u Aks, ISKs, Folliculitis/Rosacea, Seb Derm, Acanthosis Nigricans.  I, Othelia Pulling, RMA, am acting as scribe for Sarina Ser, MD .  Documentation: I have reviewed the above documentation for accuracy and completeness, and I agree with the above.  Sarina Ser, MD

## 2020-10-19 ENCOUNTER — Encounter: Payer: Self-pay | Admitting: Dermatology

## 2020-10-22 ENCOUNTER — Telehealth: Payer: Self-pay

## 2020-10-22 NOTE — Telephone Encounter (Signed)
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

## 2020-10-22 NOTE — Telephone Encounter (Signed)
LM on VM please return my call  

## 2020-10-24 ENCOUNTER — Telehealth: Payer: Self-pay

## 2020-10-24 NOTE — Telephone Encounter (Signed)
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

## 2020-10-24 NOTE — Telephone Encounter (Signed)
Called patient and advised him of biopsy results. Patient states he has memory issues and would like to call Monday to schedule surgery. He also asked I call his spouse and advise her of results. Left message with spouse to return my call.

## 2020-10-24 NOTE — Telephone Encounter (Signed)
Spoke with patient's wife and patient has been scheduled for surgery.

## 2020-11-19 ENCOUNTER — Other Ambulatory Visit: Payer: Self-pay

## 2020-11-19 ENCOUNTER — Telehealth: Payer: Self-pay

## 2020-11-19 ENCOUNTER — Ambulatory Visit: Payer: Medicare Other | Admitting: Dermatology

## 2020-11-19 ENCOUNTER — Encounter: Payer: Self-pay | Admitting: Dermatology

## 2020-11-19 DIAGNOSIS — D2272 Melanocytic nevi of left lower limb, including hip: Secondary | ICD-10-CM | POA: Diagnosis not present

## 2020-11-19 DIAGNOSIS — L821 Other seborrheic keratosis: Secondary | ICD-10-CM

## 2020-11-19 DIAGNOSIS — D239 Other benign neoplasm of skin, unspecified: Secondary | ICD-10-CM

## 2020-11-19 MED ORDER — MUPIROCIN 2 % EX OINT: 1.0000 "application " | TOPICAL_OINTMENT | Freq: Every day | CUTANEOUS | 0 refills | Status: AC

## 2020-11-19 NOTE — Patient Instructions (Signed)
If you have any questions or concerns for your doctor, please call our main line at [phone] and press option 4 to reach your doctor's medical assistant. If no one answers, please leave a voicemail as directed and we will return your call as soon as possible. Messages left after 4 pm will be answered the following business day.   You may also send us a message via MyChart. We typically respond to MyChart messages within 1-2 business days.  For prescription refills, please ask your pharmacy to contact our office. Our fax number is [phone].  If you have an urgent issue when the clinic is closed that cannot wait until the next business day, you can page your doctor at the number below.    Please note that while we do our best to be available for urgent issues outside of office hours, we are not available 24/7.   If you have an urgent issue and are unable to reach us, you may choose to seek medical care at your doctor's office, retail clinic, urgent care center, or emergency room.  If you have a medical emergency, please immediately call 911 or go to the emergency department.  Pager Numbers  - Dr. Kowalski: [phone]  - Dr. Moye: [phone]  - Dr. Stewart: [phone]  In the event of inclement weather, please call our main line at [phone] for an update on the status of any delays or closures.  Dermatology Medication Tips: Please keep the boxes that topical medications come in in order to help keep track of the instructions about where and how to use these. Pharmacies typically print the medication instructions only on the boxes and not directly on the medication tubes.   If your medication is too expensive, please contact our office at [phone] option 4 or send us a message through MyChart.   We are unable to tell what your co-pay for medications will be in advance as this is different depending on your insurance coverage. However, we may be able to find a  substitute medication at lower cost or fill out paperwork to get insurance to cover a needed medication.   If a prior authorization is required to get your medication covered by your insurance company, please allow us 1-2 business days to complete this process.  Drug prices often vary depending on where the prescription is filled and some pharmacies may offer cheaper prices.  The website www.goodrx.com contains coupons for medications through different pharmacies. The prices here do not account for what the cost may be with help from insurance (it may be cheaper with your insurance), but the website can give you the price if you did not use any insurance.  - You can print the associated coupon and take it with your prescription to the pharmacy.  - You may also stop by our office during regular business hours and pick up a GoodRx coupon card.  - If you need your prescription sent electronically to a different pharmacy, notify our office through  MyChart or by phone at [phone] option 4.     Wound Care Instructions  1. Cleanse wound gently with soap and water once a day then pat dry with clean gauze. Apply a thing coat of Petrolatum (petroleum jelly, "Vaseline") over the wound (unless you have an allergy to this). We recommend that you use a new, sterile tube of Vaseline. Do not pick or remove scabs. Do not remove the yellow or white "healing tissue" from the base of the wound.    2. Cover the wound with fresh, clean, nonstick gauze and secure with paper tape. You may use Band-Aids in place of gauze and tape if the would is small enough, but would recommend trimming much of the tape off as there is often too much. Sometimes Band-Aids can irritate the skin.  3. You should call the office for your biopsy report after 1 week if you have not already been contacted.  4. If you experience any problems, such as abnormal amounts of bleeding, swelling, significant bruising, significant pain,  or evidence of infection, please call the office immediately.  5. FOR ADULT SURGERY PATIENTS: If you need something for pain relief you may take 1 extra strength Tylenol (acetaminophen) AND 2 Ibuprofen (200mg each) together every 4 hours as needed for pain. (do not take these if you are allergic to them or if you have a reason you should not take them.) Typically, you may only need pain medication for 1 to 3 days.     

## 2020-11-19 NOTE — Progress Notes (Signed)
   Follow-Up Visit   Subjective  Brian Howell is a 50 y.o. male who presents for the following: Moderate to severe dysplastic nevus, bx proven (L post thigh, pt presents for excision).  Patient accompanied by wife who contributes to history.  The following portions of the chart were reviewed this encounter and updated as appropriate:   Tobacco  Allergies  Meds  Problems  Med Hx  Surg Hx  Fam Hx      Review of Systems:  No other skin or systemic complaints except as noted in HPI or Assessment and Plan.  Objective  Well appearing patient in no apparent distress; mood and affect are within normal limits.  A focused examination was performed including L post thigh. Relevant physical exam findings are noted in the Assessment and Plan.  Objective  Left posterior thigh: Pink bx site 1.2 x 0.8cm   Assessment & Plan  Dysplastic nevus Left posterior thigh  Bx proven Moderate to Severe Dysplastic Nevus  Start mupirocin ointment qd to excision site with dressing changes Cont Amoxicillin as prescribed by dentist  Skin excision - Left posterior thigh  Lesion length (cm):  1.2 Lesion width (cm):  0.8 Margin per side (cm):  0.2 Total excision diameter (cm):  1.6 Informed consent: discussed and consent obtained   Timeout: patient name, date of birth, surgical site, and procedure verified   Procedure prep:  Patient was prepped and draped in usual sterile fashion Prep type:  Isopropyl alcohol and povidone-iodine Anesthesia: the lesion was anesthetized in a standard fashion   Anesthetic:  1% lidocaine w/ epinephrine 1-100,000 buffered w/ 8.4% NaHCO3 (10.0cc) Instrument used: #15 blade   Hemostasis achieved with: pressure   Hemostasis achieved with comment:  Electrocautery Outcome: patient tolerated procedure well with no complications   Post-procedure details: sterile dressing applied and wound care instructions given   Dressing: Mupirocin.    Skin repair - Left posterior  thigh Complexity:  Complex Final length (cm):  5 Reason for type of repair: reduce tension to allow closure, reduce the risk of dehiscence, infection, and necrosis, reduce subcutaneous dead space and avoid a hematoma, allow closure of the large defect, preserve normal anatomy, preserve normal anatomical and functional relationships and enhance both functionality and cosmetic results   Undermining: area extensively undermined   Undermining comment:  Undermining Defect 1.6cm Subcutaneous layers (deep stitches):  Suture size:  2-0 Suture type: Vicryl (polyglactin 910)   Subcutaneous suture technique: Inverted Dermal. Fine/surface layer approximation (top stitches):  Suture size:  3-0 Suture type: nylon   Stitches: simple running   Suture removal (days):  7 Hemostasis achieved with: pressure Outcome: patient tolerated procedure well with no complications   Post-procedure details: sterile dressing applied and wound care instructions given   Dressing type: bandage, pressure dressing and bacitracin (Mupirocin)    mupirocin ointment (BACTROBAN) 2 % - Left posterior thigh  Specimen 1 - Surgical pathology Differential Diagnosis: D48.5 Bx proven Moderate to severe dysplastic nevus Check Margins: yes Pink bx site 1.2 x 0.8cm [id number]  Return in about 1 week (around 11/26/2020) for suture removal.  I, Othelia Pulling, RMA, am acting as scribe for Sarina Ser, MD .  Documentation: I have reviewed the above documentation for accuracy and completeness, and I agree with the above.  Sarina Ser, MD

## 2020-11-19 NOTE — Telephone Encounter (Signed)
Left patient message to call if any problems after today's surgery.Brian Howell

## 2020-11-20 ENCOUNTER — Encounter: Payer: Self-pay | Admitting: Dermatology

## 2020-11-26 ENCOUNTER — Ambulatory Visit (INDEPENDENT_AMBULATORY_CARE_PROVIDER_SITE_OTHER): Payer: Medicare Other | Admitting: Dermatology

## 2020-11-26 ENCOUNTER — Encounter: Payer: Self-pay | Admitting: Dermatology

## 2020-11-26 ENCOUNTER — Other Ambulatory Visit: Payer: Self-pay

## 2020-11-26 DIAGNOSIS — Z86018 Personal history of other benign neoplasm: Secondary | ICD-10-CM

## 2020-11-26 NOTE — Progress Notes (Signed)
   Follow-Up Visit   Subjective  Brian Howell is a 50 y.o. male who presents for the following: suture removal (For pathology proven margins free severely dysplastic nevus of the L post thigh - patient is here today for suture removal).  The following portions of the chart were reviewed this encounter and updated as appropriate:   Tobacco  Allergies  Meds  Problems  Med Hx  Surg Hx  Fam Hx     Review of Systems:  No other skin or systemic complaints except as noted in HPI or Assessment and Plan.  Objective  Well appearing patient in no apparent distress; mood and affect are within normal limits.  A focused examination was performed including the post leg. Relevant physical exam findings are noted in the Assessment and Plan.  Objective  L post thigh: Healing excision site.  Assessment & Plan  History of dysplastic nevus L post thigh  Encounter for Removal of Sutures - Incision site at the L post thigh is clean, dry and intact - Wound cleansed, sutures removed, wound cleansed and steri strips applied.  - Discussed pathology results showing margins free severely dysplastic nevus. - Patient advised to keep steri-strips dry until they fall off. - Scars remodel for a full year. - Once steri-strips fall off, patient can apply over-the-counter silicone scar cream each night to help with scar remodeling if desired. - Patient advised to call with any concerns or if they notice any new or changing lesions.   Return for appointment as scheduled.  Luther Redo, CMA, am acting as scribe for Sarina Ser, MD .  Documentation: I have reviewed the above documentation for accuracy and completeness, and I agree with the above.  Sarina Ser, MD

## 2020-11-26 NOTE — Patient Instructions (Signed)

## 2020-12-18 ENCOUNTER — Ambulatory Visit: Payer: Medicare Other | Admitting: Dermatology

## 2020-12-18 ENCOUNTER — Other Ambulatory Visit: Payer: Self-pay

## 2020-12-18 DIAGNOSIS — L820 Inflamed seborrheic keratosis: Secondary | ICD-10-CM | POA: Diagnosis not present

## 2020-12-18 DIAGNOSIS — L280 Lichen simplex chronicus: Secondary | ICD-10-CM | POA: Diagnosis not present

## 2020-12-18 DIAGNOSIS — L83 Acanthosis nigricans: Secondary | ICD-10-CM

## 2020-12-18 DIAGNOSIS — Z872 Personal history of diseases of the skin and subcutaneous tissue: Secondary | ICD-10-CM

## 2020-12-18 DIAGNOSIS — L578 Other skin changes due to chronic exposure to nonionizing radiation: Secondary | ICD-10-CM

## 2020-12-18 DIAGNOSIS — T8130XA Disruption of wound, unspecified, initial encounter: Secondary | ICD-10-CM

## 2020-12-18 DIAGNOSIS — L821 Other seborrheic keratosis: Secondary | ICD-10-CM

## 2020-12-18 DIAGNOSIS — L739 Follicular disorder, unspecified: Secondary | ICD-10-CM

## 2020-12-18 DIAGNOSIS — L219 Seborrheic dermatitis, unspecified: Secondary | ICD-10-CM

## 2020-12-18 DIAGNOSIS — Z86018 Personal history of other benign neoplasm: Secondary | ICD-10-CM

## 2020-12-18 MED ORDER — UREA 40 % EX CREA: TOPICAL_CREAM | CUTANEOUS | 1 refills | Status: AC

## 2020-12-18 NOTE — Progress Notes (Signed)
Follow-Up Visit   Subjective  Brian Howell is a 50 y.o. male who presents for the following: Actinic Keratosis (Of the scalp x 2 - check for persistence), ISK (Of the back and R areola - some persistent skin lesions), seborrheic dermatitis (Of the face, scalp, and axillary area - improving with Ketoconazole 2% shampoo and HC 2.5% sol), and recheck surgical site (Of the L post thigh - Severely dysplastic nevus bx proven margins free).  The following portions of the chart were reviewed this encounter and updated as appropriate:   Tobacco  Allergies  Meds  Problems  Med Hx  Surg Hx  Fam Hx     Review of Systems:  No other skin or systemic complaints except as noted in HPI or Assessment and Plan.  Objective  Well appearing patient in no apparent distress; mood and affect are within normal limits.  A focused examination was performed including the face, trunk, and extremities. Relevant physical exam findings are noted in the Assessment and Plan.  Objective  Back x 6, R areola x 1, L hand x 1 (8): Erythematous keratotic or waxy stuck-on papule or plaque.   Objective  Face, scalp, axillary area: Minimal scale of the scalp  Objective  Neck: Velvety patches of the neck.  Objective  Scalp: Clear.  Objective  Scalp: One crusted papule of the scalp.  Objective  R thigh anterior: Crusted ulceration  Assessment & Plan  Inflamed seborrheic keratosis (8) Back x 6, R areola x 1, L hand x 1 Destruction of lesion - Back x 6, R areola x 1, L hand x 1 Complexity: simple   Destruction method: cryotherapy   Informed consent: discussed and consent obtained   Timeout:  patient name, date of birth, surgical site, and procedure verified Lesion destroyed using liquid nitrogen: Yes   Region frozen until ice ball extended beyond lesion: Yes   Outcome: patient tolerated procedure well with no complications   Post-procedure details: wound care instructions given    Seborrheic  dermatitis Face, scalp, axillary area Seborrheic Dermatitis  -  is a chronic persistent rash characterized by pinkness and scaling most commonly of the mid face but also can occur on the scalp (dandruff), ears; mid chest and mid back. It tends to be exacerbated by stress and cooler weather.  People who have neurologic disease may experience new onset or exacerbation of existing seborrheic dermatitis.  The condition is not curable but treatable and can be controlled.  Continue Ketoconazole 2% shampoo QD 3d/wk and HC 2.5% solution  QD 3d/wk.  Other Related Medications HYDROCORTISONE, TOPICAL, (TEXACORT) 2.5 % SOLN ketoconazole (NIZORAL) 2 % shampoo  Acanthosis nigricans Neck Advised patient that condition is treatable but not curable. Start Urea 40% cream QD. Consider topical retinoid if not improving.  Chronic and persistent.  May be related to skin fold in the neck but also could be related to hormonal issues such as the patient's diabetes. urea (CARMOL) 40 % CREA - Neck  History of actinic keratosis Scalp Clear. Observe for recurrence. Call clinic for new or changing lesions.  Recommend regular skin exams, daily broad-spectrum spf 30+ sunscreen use, and photoprotection.    Lichenification /atopic dermatitis Sup buttocks crease Lichenoid dermatitis -  Start HC 2.5% solution to aa's QD 3d/wk. Atopic dermatitis (eczema) is a chronic, relapsing, pruritic condition that can significantly affect quality of life. It is often associated with allergic rhinitis and/or asthma and can require treatment with topical medications, phototherapy, or in severe cases a  biologic medication called Dupixent in older children and adults.   Folliculitis Scalp Continue Ketoconazole 2% shampoo QD 3d/qk.  Skin medicinals triple rosacea cream daily  discussed with patient that he can still order the rosacea triple mix and use that for even better results.   Spitting suture, initial encounter R thigh  anterior Patient to follow up with surgeon who performed the procedure - he states he is seeing Dr. Marry Guan later this week. Lesion cleaned with Puracyn spray, applied Mupirocin 2% ointment, and covered with bandage today.   Seborrheic Keratoses - Stuck-on, waxy, tan-brown papules and/or plaques  - Benign-appearing - Discussed benign etiology and prognosis. - Observe - Call for any changes  Actinic Damage - chronic, secondary to cumulative UV radiation exposure/sun exposure over time - diffuse scaly erythematous macules with underlying dyspigmentation - Recommend daily broad spectrum sunscreen SPF 30+ to sun-exposed areas, reapply every 2 hours as needed.  - Recommend staying in the shade or wearing long sleeves, sun glasses (UVA+UVB protection) and wide brim hats (4-inch brim around the entire circumference of the hat). - Call for new or changing lesions.  History of Dysplastic Nevus - L post thigh  - No evidence of recurrence today - Recommend regular full body skin exams - Recommend daily broad spectrum sunscreen SPF 30+ to sun-exposed areas, reapply every 2 hours as needed.  - Call if any new or changing lesions are noted between office visits  Return in about 6 months (around 06/19/2021).  Luther Redo, CMA, am acting as scribe for Sarina Ser, MD .  Documentation: I have reviewed the above documentation for accuracy and completeness, and I agree with the above.  Sarina Ser, MD

## 2020-12-18 NOTE — Patient Instructions (Signed)

## 2020-12-19 ENCOUNTER — Ambulatory Visit: Payer: Medicare Other | Admitting: *Deleted

## 2020-12-22 ENCOUNTER — Encounter: Payer: Self-pay | Admitting: Dermatology

## 2021-06-16 ENCOUNTER — Ambulatory Visit: Payer: Medicare Other | Admitting: Dermatology

## 2021-09-14 ENCOUNTER — Ambulatory Visit: Payer: Medicare HMO | Admitting: Psychiatry

## 2022-01-16 ENCOUNTER — Encounter
Admission: RE | Disposition: A | Discharge status: Home / Self Care | Payer: Self-pay | Source: Home / Self Care | Attending: Cardiology

## 2022-01-16 ENCOUNTER — Other Ambulatory Visit: Payer: Self-pay

## 2022-01-16 ENCOUNTER — Ambulatory Visit
Admission: RE | Admit: 2022-01-16 | Discharge: 2022-01-16 | Disposition: A | Discharge status: Home / Self Care | Payer: Medicare Other | Attending: Cardiology | Admitting: Cardiology

## 2022-01-16 ENCOUNTER — Encounter: Payer: Self-pay | Admitting: Cardiology

## 2022-01-16 DIAGNOSIS — Z7984 Long term (current) use of oral hypoglycemic drugs: Secondary | ICD-10-CM | POA: Insufficient documentation

## 2022-01-16 DIAGNOSIS — Z794 Long term (current) use of insulin: Secondary | ICD-10-CM | POA: Diagnosis not present

## 2022-01-16 DIAGNOSIS — R079 Chest pain, unspecified: Secondary | ICD-10-CM | POA: Diagnosis present

## 2022-01-16 DIAGNOSIS — E785 Hyperlipidemia, unspecified: Secondary | ICD-10-CM | POA: Diagnosis not present

## 2022-01-16 DIAGNOSIS — R0602 Shortness of breath: Secondary | ICD-10-CM | POA: Diagnosis not present

## 2022-01-16 DIAGNOSIS — Z79891 Long term (current) use of opiate analgesic: Secondary | ICD-10-CM | POA: Diagnosis not present

## 2022-01-16 DIAGNOSIS — I10 Essential (primary) hypertension: Secondary | ICD-10-CM | POA: Insufficient documentation

## 2022-01-16 DIAGNOSIS — G8929 Other chronic pain: Secondary | ICD-10-CM | POA: Diagnosis not present

## 2022-01-16 DIAGNOSIS — I2510 Atherosclerotic heart disease of native coronary artery without angina pectoris: Secondary | ICD-10-CM | POA: Insufficient documentation

## 2022-01-16 DIAGNOSIS — R9439 Abnormal result of other cardiovascular function study: Secondary | ICD-10-CM

## 2022-01-16 DIAGNOSIS — E1140 Type 2 diabetes mellitus with diabetic neuropathy, unspecified: Secondary | ICD-10-CM | POA: Diagnosis not present

## 2022-01-16 HISTORY — PX: LEFT HEART CATH AND CORONARY ANGIOGRAPHY: CATH118249

## 2022-01-16 LAB — GLUCOSE, CAPILLARY
Glucose-Capillary: 232 mg/dL — ABNORMAL HIGH (ref 70–99)
Glucose-Capillary: 255 mg/dL — ABNORMAL HIGH (ref 70–99)

## 2022-01-16 SURGERY — LEFT HEART CATH AND CORONARY ANGIOGRAPHY
Anesthesia: Moderate Sedation

## 2022-01-16 MED ORDER — IOHEXOL 300 MG/ML  SOLN
INTRAMUSCULAR | Status: DC | PRN
  Administered 2022-01-16: 35 mL

## 2022-01-16 MED ORDER — ASPIRIN 81 MG PO TBEC: 81.0000 mg | DELAYED_RELEASE_TABLET | Freq: Every day | ORAL | 2 refills | Status: AC

## 2022-01-16 MED ORDER — SODIUM CHLORIDE 0.9 % IV SOLN: INTRAVENOUS | Status: DC

## 2022-01-16 MED ORDER — VERAPAMIL HCL 2.5 MG/ML IV SOLN
INTRAVENOUS | Status: DC | PRN
  Administered 2022-01-16: 2.5 mg via INTRAVENOUS

## 2022-01-16 MED ORDER — FENTANYL CITRATE (PF) 100 MCG/2ML IJ SOLN
INTRAMUSCULAR | Status: DC | PRN
  Administered 2022-01-16: 25 ug via INTRAVENOUS

## 2022-01-16 MED ORDER — LIDOCAINE HCL 1 % IJ SOLN
INTRAMUSCULAR | Status: AC
  Filled 2022-01-16: qty 20

## 2022-01-16 MED ORDER — MIDAZOLAM HCL 2 MG/2ML IJ SOLN
INTRAMUSCULAR | Status: DC | PRN
  Administered 2022-01-16: 1 mg via INTRAVENOUS

## 2022-01-16 MED ORDER — HEPARIN (PORCINE) IN NACL 1000-0.9 UT/500ML-% IV SOLN
INTRAVENOUS | Status: AC
  Filled 2022-01-16: qty 1000

## 2022-01-16 MED ORDER — ASPIRIN 81 MG PO CHEW: 324.0000 mg | CHEWABLE_TABLET | ORAL | Status: AC

## 2022-01-16 MED ORDER — MIDAZOLAM HCL 2 MG/2ML IJ SOLN
INTRAMUSCULAR | Status: AC
  Filled 2022-01-16: qty 2

## 2022-01-16 MED ORDER — SODIUM CHLORIDE 0.9 % IV SOLN: 250.0000 mL | INTRAVENOUS | Status: DC | PRN

## 2022-01-16 MED ORDER — HEPARIN SODIUM (PORCINE) 1000 UNIT/ML IJ SOLN
INTRAMUSCULAR | Status: DC | PRN
  Administered 2022-01-16: 5000 [IU] via INTRAVENOUS

## 2022-01-16 MED ORDER — SODIUM CHLORIDE 0.9% FLUSH: 3.0000 mL | Freq: Two times a day (BID) | INTRAVENOUS | Status: DC

## 2022-01-16 MED ORDER — ONDANSETRON HCL 4 MG/2ML IJ SOLN: 4.0000 mg | Freq: Four times a day (QID) | INTRAMUSCULAR | Status: DC | PRN

## 2022-01-16 MED ORDER — VERAPAMIL HCL 2.5 MG/ML IV SOLN
INTRAVENOUS | Status: AC
  Filled 2022-01-16: qty 2

## 2022-01-16 MED ORDER — LIDOCAINE HCL (PF) 1 % IJ SOLN
INTRAMUSCULAR | Status: DC | PRN
  Administered 2022-01-16: 2 mL

## 2022-01-16 MED ORDER — ACETAMINOPHEN 325 MG PO TABS: 650.0000 mg | ORAL_TABLET | ORAL | Status: DC | PRN

## 2022-01-16 MED ORDER — HEPARIN (PORCINE) IN NACL 2000-0.9 UNIT/L-% IV SOLN
INTRAVENOUS | Status: DC | PRN
  Administered 2022-01-16: 1000 mL

## 2022-01-16 MED ORDER — SODIUM CHLORIDE 0.9% FLUSH: 3.0000 mL | INTRAVENOUS | Status: DC | PRN

## 2022-01-16 MED ORDER — FENTANYL CITRATE (PF) 100 MCG/2ML IJ SOLN
INTRAMUSCULAR | Status: AC
  Filled 2022-01-16: qty 2

## 2022-01-16 MED ORDER — HEPARIN SODIUM (PORCINE) 1000 UNIT/ML IJ SOLN
INTRAMUSCULAR | Status: AC
  Filled 2022-01-16: qty 10

## 2022-01-16 MED ORDER — ASPIRIN 81 MG PO CHEW
CHEWABLE_TABLET | ORAL | Status: AC
  Administered 2022-01-16: 324 mg via ORAL
  Filled 2022-01-16: qty 4

## 2022-01-16 SURGICAL SUPPLY — 11 items
CATH 5FR JL3.5 JR4 ANG PIG MP (CATHETERS) ×1 IMPLANT
DEVICE RAD TR BAND REGULAR (VASCULAR PRODUCTS) ×1 IMPLANT
DRAPE BRACHIAL (DRAPES) ×1 IMPLANT
GLIDESHEATH SLEND SS 6F .021 (SHEATH) ×1 IMPLANT
GUIDEWIRE INQWIRE 1.5J.035X260 (WIRE) IMPLANT
INQWIRE 1.5J .035X260CM (WIRE) ×2
KIT SYRINGE INJ CVI SPIKEX1 (MISCELLANEOUS) ×1 IMPLANT
PACK CARDIAC CATH (CUSTOM PROCEDURE TRAY) ×2 IMPLANT
PROTECTION STATION PRESSURIZED (MISCELLANEOUS) ×2
SET ATX SIMPLICITY (MISCELLANEOUS) ×1 IMPLANT
STATION PROTECTION PRESSURIZED (MISCELLANEOUS) IMPLANT

## 2022-01-16 NOTE — H&P (Signed)
[facility] Cardiology History and Physical  Patient ID: Brian Howell MRN: [id number] DOB/AGE: May 13, 1971 51 y.o. Admit date: 01/16/2022  Primary Care Physician: Idelle Crouch, MD Primary Cardiologist Andrez Grime, MD   HPI:   Brian Howell is a 51 year old male with morbid obesity, type 2 diabetes, hypertension, hyperlipidemia, myocarditis, chronic pain on opiates who presents for scheduled left heart catheterization.  Notably he has been describing shortness of breath with exertion and atypical chest pain.  He underwent a nuclear medicine stress test that showed inferior ischemia, but echocardiogram was normal.  Because of his persistent severe symptoms despite antianginal therapy we are proceeding with heart catheterization for further evaluation.   Past Medical History:  Diagnosis Date   Anxiety    Arthritis    Diabetes mellitus, type II ([place])    Dysplastic nevus 10/16/2020   Left post. thigh. Moderate to severe atypia, lateral margin involved.  excised 11/19/20   Headache    Hypertension    Memory deficit    Neuropathy    Sleep apnea    cpap    Past Surgical History:  Procedure Laterality Date   CHOLECYSTECTOMY     KNEE ARTHROPLASTY Right 06/17/2020   Procedure: COMPUTER ASSISTED TOTAL KNEE ARTHROPLASTY;  Surgeon: Dereck Leep, MD;  Location: [facility] ORS;  Service: Orthopedics;  Laterality: Right;   KNEE ARTHROSCOPY Right    meniscus    Medications Prior to Admission  Medication Sig Dispense Refill Last Dose   Carboxymethylcellul-Glycerin (LUBRICATING EYE DROPS OP) Place 1 drop into both eyes daily as needed (dry eyes).   Past Month   clonazePAM (KLONOPIN) 0.5 MG tablet Take 0.5 mg by mouth at bedtime.   01/15/2022   Cyanocobalamin (B-12 COMPLIANCE INJECTION) 1000 MCG/ML KIT Inject 1,000 mcg as directed every 30 (thirty) days.   Past Month   DULoxetine (CYMBALTA) 60 MG capsule Take 60 mg by mouth 2 (two) times daily.    01/16/2022   furosemide (LASIX) 20 MG  tablet Take 20 mg by mouth daily as needed for fluid or edema.   Past Month   hydrOXYzine (ATARAX/VISTARIL) 25 MG tablet Take 25 mg by mouth 3 (three) times daily.    01/16/2022   insulin NPH Human (NOVOLIN N) 100 UNIT/ML injection Inject 35-75 Units into the skin See admin instructions. Inject 75 units in the morning and 35 units at night   01/16/2022   insulin regular (NOVOLIN R) 100 units/mL injection Inject 35-55 Units into the skin See admin instructions. Inject 55 units with breakfast, 35 units at lunch time, and 55 units with dinner  Sliding scale   01/15/2022   losartan (COZAAR) 25 MG tablet Take 25 mg by mouth daily.    01/16/2022   Melatonin 10 MG CAPS Take 10 mg by mouth at bedtime.   01/15/2022   simvastatin (ZOCOR) 20 MG tablet Take 20 mg by mouth at bedtime.    01/15/2022   traZODone (DESYREL) 100 MG tablet TAKE 1 TABLET BY MOUTH AT BEDTIME AS NEEDED FOR SLEEP 90 tablet 1 01/15/2022   acetaminophen (TYLENOL) 500 MG tablet Take 1,000 mg by mouth every 6 (six) hours as needed for moderate pain or headache.      celecoxib (CELEBREX) 200 MG capsule Take 1 capsule (200 mg total) by mouth 2 (two) times daily. 90 capsule 0    enoxaparin (LOVENOX) 40 MG/0.4ML injection Inject 0.4 mLs (40 mg total) into the skin daily for 14 days. 5.6 mL 0    glucose 4 GM chewable  tablet Chew 8-12 g by mouth as needed for low blood sugar.      glucose blood test strip       HYDROCORTISONE, TOPICAL, (TEXACORT) 2.5 % SOLN Apply 1 application topically 3 (three) times a week. Apply to rash in beard area 3 nights a week 30 mL 3    Insulin Syringe-Needle U-100 (INSULIN SYRINGE .3CC/29GX1/2") 29G X 1/2" 0.3 ML MISC       ketoconazole (NIZORAL) 2 % shampoo Apply 1 application topically 3 (three) times a week. Wash scalp, axilla and beard area 3 times weekly let sit 5 minutes and rinse out 120 mL 3    metFORMIN (GLUCOPHAGE) 1000 MG tablet Take 1,000 mg by mouth 2 (two) times daily.   01/14/2022   mupirocin ointment (BACTROBAN)  2 % Apply 1 application topically daily. Qd to excision site 22 g 0    ONETOUCH DELICA LANCETS FINE MISC       oxyCODONE (OXY IR/ROXICODONE) 5 MG immediate release tablet Take 1 tablet (5 mg total) by mouth every 4 (four) hours as needed for moderate pain (pain score 4-6). (Patient not taking: Reported on 01/16/2022) 30 tablet 0 Not Taking   Oxycodone HCl 10 MG TABS Take 10 mg by mouth 3 (three) times daily.       tiZANidine (ZANAFLEX) 4 MG tablet Take 4 mg by mouth every 8 (eight) hours as needed for muscle spasms.      urea (CARMOL) 40 % CREA Apply to the neck QD. 198 g 1    Social History   Socioeconomic History   Marital status: Married    Spouse name: Not on file   Number of children: Not on file   Years of education: Not on file   Highest education level: Not on file  Occupational History   Not on file  Tobacco Use   Smoking status: Never   Smokeless tobacco: Never  Vaping Use   Vaping Use: Never used  Substance and Sexual Activity   Alcohol use: No    Alcohol/week: 0.0 standard drinks   Drug use: No   Sexual activity: Never  Other Topics Concern   Not on file  Social History Narrative   Not on file   Social Determinants of Health   Financial Resource Strain: Not on file  Food Insecurity: Not on file  Transportation Needs: Not on file  Physical Activity: Not on file  Stress: Not on file  Social Connections: Not on file  Intimate Partner Violence: Not on file    Family History  Problem Relation Age of Onset   Anxiety disorder Mother    Depression Mother    Diabetes Mother    Heart attack Father    Stroke Father    Bipolar disorder Sister    Diabetes Sister    Diabetes Brother    Diabetes Sister    Stroke Sister    Heart attack Sister       Review of systems complete and found to be negative unless listed above      Physical Exam:  General: Well developed, well nourished, in no acute distress HEENT:  Normocephalic and atramatic Neck:  No JVD.   Lungs: Clear bilaterally to auscultation and percussion. Heart: HRRR . Normal S1 and S2 without gallops or murmurs.  Abdomen: Bowel sounds are positive, abdomen soft and non-tender  Msk:  Back normal, normal gait. Normal strength and tone for age. Extremities: No clubbing, cyanosis or edema.   Neuro: Alert and oriented  X 3. Psych:  Good affect, responds appropriately   Labs:   Lab Results  Component Value Date   WBC 7.6 06/13/2020   HGB 13.0 06/13/2020   HCT 39.6 06/13/2020   MCV 84.1 06/13/2020   PLT 196 06/13/2020   No results for input(s): NA, K, CL, CO2, BUN, CREATININE, CALCIUM, PROT, BILITOT, ALKPHOS, ALT, AST, GLUCOSE in the last 168 hours.  Invalid input(s): LABALBU No results found for: CKTOTAL, CKMB, CKMBINDEX, TROPONINI No results found for: CHOL No results found for: HDL No results found for: LDLCALC No results found for: TRIG No results found for: CHOLHDL No results found for: LDLDIRECT    Radiology: No results found.  EKG: NSR. RBBB  ASSESSMENT AND PLAN:   51 year old male with multiple risk factors for coronary disease who has been experiencing shortness of breath with exertion as well as atypical chest pain.  He had an abnormal nuclear medicine stress test with inferior reversibility, and thus we are proceeding with heart catheterization for further evaluation.  The risk and benefits were discussed at length with the patient and his wife who are agreeable to proceed.  We will attempt a right radial artery approach.  Signed: Andrez Grime MD 01/16/2022, 7:56 AM

## 2022-03-14 LAB — COLOGUARD: COLOGUARD: POSITIVE — AB

## 2022-09-03 ENCOUNTER — Other Ambulatory Visit: Payer: Self-pay | Admitting: Internal Medicine

## 2022-09-03 DIAGNOSIS — H539 Unspecified visual disturbance: Secondary | ICD-10-CM

## 2022-09-08 NOTE — Progress Notes (Addendum)
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 Moderate episode of recurrent major depressive disorder (HCC)  F33.1       History of Present Illness:   Brian Howell is a 52 y.o. year old male with a history of depression, anxiety, dissociative disorder, hypertension, hyperlipidemia, sleep apnea on CPAP, who is referred for depression.   He states that he was diagnosed with dissociative cognitive disorder.  He used to be seen at [facility] psychiatry, and his care has been left in limbo since 2021 the [place] hit.  He has memory issues, losing track of time.  He talks about an example of him getting up in the morning, fixing breakfast, and finding himself at [employer].  It occurs a few times per week for the last 8-10 years.  He has difficulty in finding words.  He feels guilty due to his wife becoming like a caregiver. She helps him to keep his medication and the appointment.   Depression- He feels depressed all the time, expressing the statement that "how I could not be."  Although he used to play basketball, and had a wonderful life, he enjoyed talking to people, he does not know what to talk with others anymore. He feels irritable and agitated easily. He feels like he does not care, although he denies SI/HI (except occasional passive SI). He sleeps 4-10 hours.  He gained weight due to lack of exercise, which she partly attributes to neuropathy. \\  Psychosis-he reports occasional AH of hearing people's voice.  He denies CAH, VH or paranoia.   Melissa, his wife presents to the visit with the patient consent.  She states that he was hit his head and other parts of his body in 2007 in the context of falling from the truck.  He did  not remember he hit his head.  He started to have issues with remembering a year later.  He could not do his new job due to him forgetting customers.  He was admitted to [facility] for 2 weeks for extensive tests, and was told to have dissociative cognitive disorder.  He was seen at [facility], and duloxetine was started.  She thinks it has been helping.  He was very irritable and agitated when he forgot to bring the medication when they went on the cruise.  He needs to be reminded of eating.  He tends to lose track of time.  He talks about things happened in 2018 as if it is still happening in 2024.  Although she denies him having any other personality, she feels that he occasionally acts like a child such as weeping.  Although she denies any safety concern, she is more concerned due to his forgetfulness.  He was found to have fallen downstairs, and he did not call anybody. (He states that he "did not pay attention" to this. He is unsure whether he intended to hide this fall that time). She is not aware of him being on any psychotropics for mood prior to the fall, nor history of emotional trauma.  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 Last PCP / ongoing medical evaluation:      Associated Signs/Symptoms: Depression Symptoms:  depressed mood, anhedonia, insomnia, fatigue, (Hypo) Manic Symptoms:   denies decreased need for sleep, euphoria Anxiety Symptoms:   mild  anxiety Psychotic Symptoms:   denies VH, paranoia. Used to hear AH  PTSD Symptoms: Negative  Past Psychiatric History:  Outpatient:  Psychiatry admission: denies Previous suicide attempt: denies  Past trials of medication:  History of violence: denies History of head injury: In 2010, he accidentally fell from a truck and hit his head on the concrete, resulting in a ten-minute loss of consciousness.  Previous Psychotropic Medications: Yes   Substance Abuse History in the last 12 months:  No.  Consequences of Substance Abuse: NA  Past Medical History:  Past Medical History:  Diagnosis Date   Anxiety    Arthritis    Diabetes mellitus, type II ([place])    Dysplastic nevus 10/16/2020   Left post. thigh. Moderate to severe atypia, lateral margin involved.  excised 11/19/20   Headache    Hypertension    Memory deficit    Neuropathy    Sleep apnea    cpap    Past Surgical History:  Procedure Laterality Date   CHOLECYSTECTOMY     KNEE ARTHROPLASTY Right 06/17/2020   Procedure: COMPUTER ASSISTED TOTAL KNEE ARTHROPLASTY;  Surgeon: Dereck Leep, MD;  Location: [facility] ORS;  Service: Orthopedics;  Laterality: Right;   KNEE ARTHROSCOPY Right    meniscus   LEFT HEART CATH AND CORONARY ANGIOGRAPHY N/A 01/16/2022   Procedure: LEFT HEART CATH AND CORONARY ANGIOGRAPHY;  Surgeon: Andrez Grime, MD;  Location: [facility];  Service: Cardiovascular;  Laterality: N/A;    Family Psychiatric History: as below  Family History:  Family History  Problem Relation Age of Onset   Anxiety disorder Mother    Depression Mother    Diabetes Mother    Heart attack Father    Stroke Father    Schizophrenia Sister    Bipolar disorder Sister    Diabetes Sister    Diabetes Sister    Stroke Sister    Heart attack Sister    Diabetes Brother     Social History:   Social History   Socioeconomic History   Marital status: Married    Spouse name: [name]   Number of children: 4    Years of education: Not on file   Highest education level: Associate degree: occupational, Hotel manager, or vocational program  Occupational History   Not on file  Tobacco Use   Smoking status: Never   Smokeless tobacco: Never  Vaping Use   Vaping Use: Never used  Substance and Sexual Activity   Alcohol use: No    Alcohol/week: 0.0 standard drinks of alcohol   Drug use: No   Sexual activity: Not Currently  Other Topics Concern   Not on file  Social History Narrative   Not on file   Social Determinants of Health   Financial Resource Strain: Not on file  Food Insecurity: Not on file  Transportation Needs:  Not on file  Physical Activity: Not on file  Stress: Not on file  Social Connections: Not on file    Additional Social History: as above  Allergies:   Allergies  Allergen Reactions   Ace Inhibitors Swelling    Lips and face   Gabapentin Rash    Metabolic Disorder Labs: No results found for: "HGBA1C", "MPG" No results found for: "PROLACTIN" No results found for: "CHOL", "TRIG", "HDL", "CHOLHDL", "VLDL", "LDLCALC" No results found for: "TSH"  Therapeutic Level Labs: No results found for: "LITHIUM" No results found for: "CBMZ" No results found for: "VALPROATE"  Current Medications: Current Outpatient Medications  Medication Sig Dispense Refill   aspirin EC 81 MG tablet Take 1 tablet (81 mg total) by mouth daily. Swallow whole. 150 tablet 2   Cyanocobalamin (B-12 COMPLIANCE INJECTION) 1000 MCG/ML KIT Inject 1,000 mcg as directed every 30 (thirty) days.     DULoxetine (CYMBALTA) 30 MG capsule Take 1 capsule (30 mg total) by mouth daily. Total of 90 mg daily. Take along with 30 mg cap 30 capsule 2   DULoxetine (CYMBALTA) 60 MG capsule Take 60 mg by mouth 2 (two) times daily.      ferrous sulfate 325 (65 FE) MG tablet Take by mouth.     furosemide (LASIX) 20 MG tablet Take by mouth.     HYDROCORTISONE, TOPICAL, (TEXACORT) 2.5 % SOLN Apply 1 application topically 3  (three) times a week. Apply to rash in beard area 3 nights a week 30 mL 3   hydrOXYzine (ATARAX/VISTARIL) 25 MG tablet Take 25 mg by mouth 3 (three) times daily.      insulin NPH Human (NOVOLIN N) 100 UNIT/ML injection Inject 35-75 Units into the skin See admin instructions. Inject 75 units in the morning and 35 units at night     insulin regular (NOVOLIN R) 100 units/mL injection Inject 35-55 Units into the skin See admin instructions. Inject 55 units with breakfast, 35 units at lunch time, and 55 units with dinner  Sliding scale     Insulin Syringe-Needle U-100 (INSULIN SYRINGE .3CC/29GX1/2") 29G X 1/2" 0.3 ML MISC      ketoconazole (NIZORAL) 2 % shampoo Apply 1 application topically 3 (three) times a week. Wash scalp, axilla and beard area 3 times weekly let sit 5 minutes and rinse out 120 mL 3   losartan (COZAAR) 25 MG tablet Take 25 mg by mouth 2 (two) times daily.     Melatonin 10 MG CAPS Take 10 mg by mouth at bedtime.     metFORMIN (GLUCOPHAGE) 1000 MG tablet Take 1,000 mg by mouth 2 (two) times daily.     mupirocin ointment (BACTROBAN) 2 % Apply 1 application topically daily. Qd to excision site 22 g 0   oxyCODONE (OXY IR/ROXICODONE) 5 MG immediate release tablet Take 1 tablet (5 mg total) by mouth every 4 (four) hours as needed for moderate pain (pain score 4-6). 30 tablet 0   traZODone (DESYREL) 100 MG tablet TAKE 1 TABLET BY MOUTH AT BEDTIME AS NEEDED FOR SLEEP 90 tablet 1   urea (CARMOL) 40 % CREA Apply to the neck QD. 198 g 1   enoxaparin (LOVENOX) 40 MG/0.4ML injection Inject 0.4 mLs (40 mg total) into the skin daily for 14 days. 5.6 mL 0   No current facility-administered medications for this visit.    Musculoskeletal: Strength & Muscle Tone: within normal limits Gait & Station: normal Patient leans: N/A  Psychiatric Specialty Exam: Review of Systems  Psychiatric/Behavioral:  Positive for confusion, decreased concentration, dysphoric mood, hallucinations, sleep disturbance  and suicidal ideas. Negative for agitation, behavioral problems and self-injury. The patient is nervous/anxious. The patient is not hyperactive.   All other systems reviewed and are negative.   Blood pressure 130/75, pulse (!) 58, temperature 98.7 F (37.1 C), temperature source Oral, height 5' 10.5" (1.791 m), weight 290 lb 3.2 oz (131.6 kg), SpO2 95 %.Body mass index is 41.05 kg/m.  General Appearance: Fairly Groomed  Eye Contact:  Good  Speech:  Clear and Coherent  Volume:  Normal  Mood:  Depressed  Affect:  Appropriate, Congruent, and Restricted  Thought Process:  Coherent  Orientation:  Full (Time, Place, and Person)  Thought Content:  Logical  Suicidal Thoughts:  Yes.  without intent/plan  Homicidal Thoughts:  No  Memory:  Immediate;   Good  Judgement:  Good  Insight:  Present  Psychomotor Activity:  Normal  Concentration:  Concentration: Good and Attention Span: Good  Recall:  Good  Fund of Knowledge:Good  Language: Good  Akathisia:  No  Handed:  Right  AIMS (if indicated):  not done  Assets:  Communication Skills Desire for Improvement  ADL's:  Intact  Cognition: WNL  Sleep:  Poor   Screenings: GAD-7    Flowsheet Row Office Visit from 09/14/2022 in [facility]  Total GAD-7 Score 14      PHQ2-9    [place] Office Visit from 09/14/2022 in [facility]  PHQ-2 Total Score 6  PHQ-9 Total Score 22      [place] Office Visit from 09/14/2022 in [employer] Error: Q3, 4, or 5 should not be populated when Q2 is No       Assessment and Plan:  Gevorg Brum is a 52 y.o. year old male with a history of depression, anxiety, dissociative disorder, hypertension, hyperlipidemia, sleep apnea on CPAP, who is referred for depression.   1. Dissociative disorder 2. Moderate episode of recurrent major depressive disorder  ([place]) Acute stressors include: n/a  Other stressors include: demoralization due to memory loss/dissociative disorder, unemployment   History: no history of significant mood symptoms prior to having head injury in 2007 (he accidentally fell from a truck and hit his head on the concrete).  He was reportedly evaluated during [facility] admission, and was diagnosed with dissociative cognitive disorder.  He was last seen at [facility] psychiatry in 2021. Exam is notable for restricted affect, and he reports worsening of depressive symptoms along with irritability without aggression over the past several years due to stressors, as mentioned above. He receives strong support from his wife. The plan is to uptitrate duloxetine to target depression .  Discussed potential risk of hypertension, headache.  Will obtain records for more collaterals.  Noted that both the patient and his wife denies any emotional trauma history.  Will continue hydroxyzine as needed for anxiety. He is currently on topiramte for unknown indication, prescribed by PCP. Will consider tapering it off if applicable to avoid impact on his cognition.  # Insomnia He has fair benefit from trazodone.  He had sleep evaluation in the past year, and has been using CPAP machine regularly.  Will continue current dose of trazodone to target insomnia.   Plan Increase duloxetine 90 mg daily  Continue hydroxyzine 25 mg daily as needed for anxiety Continue Trazodone 100 mg at night as needed for insomnia Next appointment: 3/25 at 3 PM  for 30 mins, IP Obtain record from [facility]. [facility] - TSH wnl in 08/2022. Currently on vitamin B12, last checked in 2023 - on topiramte 50 mg daily (by PCP, unknown indication),  Duloxetine 60 mg daily, hydroxyzine 25 mg daily,  trazodone 50 mg daily, Metformin 1000 mg   The patient demonstrates the following risk factors for suicide: Chronic risk factors for suicide include: psychiatric disorder of depression . Acute risk factors for  suicide include: unemployment. Protective factors for this patient include: positive social support and hope for the future. Considering these factors, the overall suicide risk at this point appears to be low. Patient is appropriate for outpatient follow up.   Collaboration of Care: Other reviewed notes in Epic  Patient/Guardian was advised Release of Information must be obtained prior to any record release in order to collaborate their care with an outside provider. Patient/Guardian was advised if they have not already done so to contact the registration department to sign all necessary forms in order for [place] to release information regarding their care.   Consent: Patient/Guardian gives verbal consent for treatment and assignment of benefits for services provided during this visit. Patient/Guardian expressed understanding and agreed to proceed.   Norman Clay, MD 1/22/20245:36 PM

## 2022-09-14 ENCOUNTER — Ambulatory Visit
Admission: RE | Admit: 2022-09-14 | Discharge: 2022-09-14 | Disposition: A | Discharge status: Home / Self Care | Payer: Medicare Other | Source: Ambulatory Visit | Attending: Internal Medicine | Admitting: Internal Medicine

## 2022-09-14 ENCOUNTER — Ambulatory Visit (INDEPENDENT_AMBULATORY_CARE_PROVIDER_SITE_OTHER): Payer: Medicare Other | Admitting: Psychiatry

## 2022-09-14 ENCOUNTER — Encounter: Payer: Self-pay | Admitting: Psychiatry

## 2022-09-14 VITALS — BP 130/75 | HR 58 | Temp 98.7°F | Ht 70.5 in | Wt 290.2 lb

## 2022-09-14 DIAGNOSIS — H539 Unspecified visual disturbance: Secondary | ICD-10-CM | POA: Diagnosis present

## 2022-09-14 DIAGNOSIS — F449 Dissociative and conversion disorder, unspecified: Secondary | ICD-10-CM

## 2022-09-14 DIAGNOSIS — R4182 Altered mental status, unspecified: Secondary | ICD-10-CM | POA: Insufficient documentation

## 2022-09-14 DIAGNOSIS — F331 Major depressive disorder, recurrent, moderate: Secondary | ICD-10-CM | POA: Diagnosis not present

## 2022-09-14 MED ORDER — DULOXETINE HCL 30 MG PO CPEP: 30.0000 mg | ORAL_CAPSULE | Freq: Every day | ORAL | 2 refills | Status: DC

## 2022-09-14 NOTE — Patient Instructions (Signed)
Increase duloxetine 90 mg daily  Continue hydroxyzine 25 mg daily as needed for anxiety Continue Trazodone 100 mg at night as needed for insomnia Next appointment: 3/25 at 3 PM

## 2022-10-12 ENCOUNTER — Ambulatory Visit: Payer: Self-pay | Admitting: Licensed Clinical Social Worker

## 2022-11-06 ENCOUNTER — Encounter: Payer: Self-pay | Admitting: *Deleted

## 2022-11-09 ENCOUNTER — Ambulatory Visit: Payer: Medicare Other | Admitting: Anesthesiology

## 2022-11-09 ENCOUNTER — Encounter
Admission: RE | Disposition: A | Discharge status: Home / Self Care | Payer: Self-pay | Source: Ambulatory Visit | Attending: Gastroenterology

## 2022-11-09 ENCOUNTER — Ambulatory Visit
Admission: RE | Admit: 2022-11-09 | Discharge: 2022-11-09 | Disposition: A | Discharge status: Home / Self Care | Payer: Medicare Other | Source: Ambulatory Visit | Attending: Gastroenterology | Admitting: Gastroenterology

## 2022-11-09 DIAGNOSIS — R195 Other fecal abnormalities: Secondary | ICD-10-CM | POA: Insufficient documentation

## 2022-11-09 DIAGNOSIS — F419 Anxiety disorder, unspecified: Secondary | ICD-10-CM | POA: Insufficient documentation

## 2022-11-09 DIAGNOSIS — Z794 Long term (current) use of insulin: Secondary | ICD-10-CM | POA: Insufficient documentation

## 2022-11-09 DIAGNOSIS — Z1211 Encounter for screening for malignant neoplasm of colon: Secondary | ICD-10-CM | POA: Insufficient documentation

## 2022-11-09 DIAGNOSIS — Z79899 Other long term (current) drug therapy: Secondary | ICD-10-CM | POA: Diagnosis not present

## 2022-11-09 DIAGNOSIS — M1990 Unspecified osteoarthritis, unspecified site: Secondary | ICD-10-CM | POA: Insufficient documentation

## 2022-11-09 DIAGNOSIS — K640 First degree hemorrhoids: Secondary | ICD-10-CM | POA: Insufficient documentation

## 2022-11-09 DIAGNOSIS — K5730 Diverticulosis of large intestine without perforation or abscess without bleeding: Secondary | ICD-10-CM | POA: Diagnosis not present

## 2022-11-09 DIAGNOSIS — K219 Gastro-esophageal reflux disease without esophagitis: Secondary | ICD-10-CM | POA: Insufficient documentation

## 2022-11-09 DIAGNOSIS — Z7984 Long term (current) use of oral hypoglycemic drugs: Secondary | ICD-10-CM | POA: Diagnosis not present

## 2022-11-09 DIAGNOSIS — I10 Essential (primary) hypertension: Secondary | ICD-10-CM | POA: Diagnosis not present

## 2022-11-09 DIAGNOSIS — R1310 Dysphagia, unspecified: Secondary | ICD-10-CM | POA: Diagnosis not present

## 2022-11-09 DIAGNOSIS — G8929 Other chronic pain: Secondary | ICD-10-CM | POA: Insufficient documentation

## 2022-11-09 DIAGNOSIS — G4733 Obstructive sleep apnea (adult) (pediatric): Secondary | ICD-10-CM | POA: Insufficient documentation

## 2022-11-09 DIAGNOSIS — Z6841 Body Mass Index (BMI) 40.0 and over, adult: Secondary | ICD-10-CM | POA: Diagnosis not present

## 2022-11-09 DIAGNOSIS — K449 Diaphragmatic hernia without obstruction or gangrene: Secondary | ICD-10-CM | POA: Diagnosis not present

## 2022-11-09 DIAGNOSIS — E1140 Type 2 diabetes mellitus with diabetic neuropathy, unspecified: Secondary | ICD-10-CM | POA: Insufficient documentation

## 2022-11-09 DIAGNOSIS — Z79891 Long term (current) use of opiate analgesic: Secondary | ICD-10-CM | POA: Diagnosis not present

## 2022-11-09 HISTORY — PX: COLONOSCOPY WITH PROPOFOL: SHX5780

## 2022-11-09 HISTORY — PX: ESOPHAGOGASTRODUODENOSCOPY (EGD) WITH PROPOFOL: SHX5813

## 2022-11-09 LAB — GLUCOSE, CAPILLARY: Glucose-Capillary: 159 mg/dL — ABNORMAL HIGH (ref 70–99)

## 2022-11-09 SURGERY — COLONOSCOPY WITH PROPOFOL
Anesthesia: General

## 2022-11-09 MED ORDER — SODIUM CHLORIDE 0.9 % IV SOLN
INTRAVENOUS | Status: DC
  Administered 2022-11-09: 20 mL/h via INTRAVENOUS

## 2022-11-09 MED ORDER — PROPOFOL 1000 MG/100ML IV EMUL
INTRAVENOUS | Status: AC
  Filled 2022-11-09: qty 100

## 2022-11-09 MED ORDER — LIDOCAINE HCL (CARDIAC) PF 100 MG/5ML IV SOSY
PREFILLED_SYRINGE | INTRAVENOUS | Status: DC | PRN
  Administered 2022-11-09: 50 mg via INTRAVENOUS

## 2022-11-09 MED ORDER — LIDOCAINE HCL (PF) 2 % IJ SOLN
INTRAMUSCULAR | Status: AC
  Filled 2022-11-09: qty 5

## 2022-11-09 MED ORDER — PROPOFOL 500 MG/50ML IV EMUL
INTRAVENOUS | Status: DC | PRN
  Administered 2022-11-09: 100 ug/kg/min via INTRAVENOUS

## 2022-11-09 MED ORDER — DEXMEDETOMIDINE HCL IN NACL 200 MCG/50ML IV SOLN
INTRAVENOUS | Status: DC | PRN
  Administered 2022-11-09: 8 ug via INTRAVENOUS
  Administered 2022-11-09: 12 ug via INTRAVENOUS

## 2022-11-09 MED ORDER — PROPOFOL 10 MG/ML IV BOLUS
INTRAVENOUS | Status: DC | PRN
  Administered 2022-11-09 (×2): 50 mg via INTRAVENOUS
  Administered 2022-11-09: 70 mg via INTRAVENOUS
  Administered 2022-11-09: 30 mg via INTRAVENOUS
  Administered 2022-11-09 (×2): 50 mg via INTRAVENOUS

## 2022-11-09 NOTE — H&P (Signed)
Outpatient short stay form Pre-procedure 11/09/2022  [name], MD  Primary Physician: Idelle Crouch, MD  Reason for visit:  GERD/Dysphagia/Positive cologuard  History of present illness:    52 y/o gentleman with history of OSA, obesity, DM II, chronic pain on opioids, and hypertension here for EGD for dysphagia and colonoscopy for positive cologuard. No blood thinners. History of cholecystectomy. No family history of GI malignancies.    Current Facility-Administered Medications:    0.9 %  sodium chloride infusion, , Intravenous, Continuous, Espyn Radwan, Hilton Cork, MD, Last Rate: 20 mL/hr at 11/09/22 0833, 20 mL/hr at 11/09/22 0833  Medications Prior to Admission  Medication Sig Dispense Refill Last Dose   aspirin EC 81 MG tablet Take 1 tablet (81 mg total) by mouth daily. Swallow whole. 150 tablet 2 11/08/2022   Cyanocobalamin (B-12 COMPLIANCE INJECTION) 1000 MCG/ML KIT Inject 1,000 mcg as directed every 30 (thirty) days.   11/08/2022   DULoxetine (CYMBALTA) 30 MG capsule Take 1 capsule (30 mg total) by mouth daily. Total of 90 mg daily. Take along with 30 mg cap 30 capsule 2 Past Week   DULoxetine (CYMBALTA) 60 MG capsule Take 60 mg by mouth 2 (two) times daily.    11/08/2022   ferrous sulfate 325 (65 FE) MG tablet Take by mouth.   Past Week   furosemide (LASIX) 20 MG tablet Take by mouth.   11/08/2022   HYDROCORTISONE, TOPICAL, (TEXACORT) 2.5 % SOLN Apply 1 application topically 3 (three) times a week. Apply to rash in beard area 3 nights a week 30 mL 3 11/08/2022   hydrOXYzine (ATARAX/VISTARIL) 25 MG tablet Take 25 mg by mouth 3 (three) times daily.    11/08/2022   insulin NPH Human (NOVOLIN N) 100 UNIT/ML injection Inject 35-75 Units into the skin See admin instructions. Inject 75 units in the morning and 35 units at night   11/09/2022 at 0615   insulin regular (NOVOLIN R) 100 units/mL injection Inject 35-55 Units into the skin See admin instructions. Inject 55 units with  breakfast, 35 units at lunch time, and 55 units with dinner  Sliding scale   11/08/2022   Insulin Syringe-Needle U-100 (INSULIN SYRINGE .3CC/29GX1/2") 29G X 1/2" 0.3 ML MISC    11/08/2022   ketoconazole (NIZORAL) 2 % shampoo Apply 1 application topically 3 (three) times a week. Wash scalp, axilla and beard area 3 times weekly let sit 5 minutes and rinse out 120 mL 3 11/08/2022   losartan (COZAAR) 25 MG tablet Take 25 mg by mouth 2 (two) times daily.   11/08/2022   Melatonin 10 MG CAPS Take 10 mg by mouth at bedtime.   11/08/2022   metFORMIN (GLUCOPHAGE) 1000 MG tablet Take 1,000 mg by mouth 2 (two) times daily.   Past Week   mupirocin ointment (BACTROBAN) 2 % Apply 1 application topically daily. Qd to excision site 22 g 0 11/08/2022   oxyCODONE (OXY IR/ROXICODONE) 5 MG immediate release tablet Take 1 tablet (5 mg total) by mouth every 4 (four) hours as needed for moderate pain (pain score 4-6). 30 tablet 0 Past Week   traZODone (DESYREL) 100 MG tablet TAKE 1 TABLET BY MOUTH AT BEDTIME AS NEEDED FOR SLEEP 90 tablet 1 11/08/2022   urea (CARMOL) 40 % CREA Apply to the neck QD. 198 g 1 11/08/2022   enoxaparin (LOVENOX) 40 MG/0.4ML injection Inject 0.4 mLs (40 mg total) into the skin daily for 14 days. 5.6 mL 0      Allergies  Allergen  Reactions   Ace Inhibitors Swelling    Lips and face   Gabapentin Rash     Past Medical History:  Diagnosis Date   Anxiety    Arthritis    Diabetes mellitus, type II ([place])    Dysplastic nevus 10/16/2020   Left post. thigh. Moderate to severe atypia, lateral margin involved.  excised 11/19/20   Headache    Hypertension    Memory deficit    Neuropathy    Sleep apnea    cpap    Review of systems:  Otherwise negative.    Physical Exam  Gen: Alert, oriented. Appears stated age.  HEENT: PERRLA. Lungs: No respiratory distress CV: RRR Abd: soft, benign, no masses Ext: No edema    Planned procedures: Proceed with EGD/colonoscopy. The patient understands  the nature of the planned procedure, indications, risks, alternatives and potential complications including but not limited to bleeding, infection, perforation, damage to internal organs and possible oversedation/side effects from anesthesia. The patient agrees and gives consent to proceed.  Please refer to procedure notes for findings, recommendations and patient disposition/instructions.     [name], MD [facility]

## 2022-11-09 NOTE — Op Note (Signed)
[facility] Gastroenterology Patient Name: Brian Howell Procedure Date: 11/09/2022 9:26 AM MRN: [id number] Account #: [id number] Date of Birth: July 01, 1971 Admit Type: Outpatient Age: 52 Room: [facility] ENDO ROOM 3 Gender: Male Note Status: Finalized Instrument Name: Altamese Cabal Endoscope [id number] Procedure:             Upper GI endoscopy Indications:           Dysphagia, Gastro-esophageal reflux disease Providers:             Andrey Farmer MD, MD Medicines:             Monitored Anesthesia Care Complications:         No immediate complications. Procedure:             Pre-Anesthesia Assessment:                        - Prior to the procedure, a History and Physical was                         performed, and patient medications and allergies were                         reviewed. The patient is competent. The risks and                         benefits of the procedure and the sedation options and                         risks were discussed with the patient. All questions                         were answered and informed consent was obtained.                         Patient identification and proposed procedure were                         verified by the physician, the nurse, the                         anesthesiologist, the anesthetist and the technician                         in the endoscopy suite. Mental Status Examination:                         alert and oriented. Airway Examination: normal                         oropharyngeal airway and neck mobility. Respiratory                         Examination: clear to auscultation. CV Examination:                         normal. Prophylactic Antibiotics: The patient does not                         require prophylactic antibiotics. Prior  Anticoagulants: The patient has taken no anticoagulant                         or antiplatelet agents. ASA Grade Assessment: III - A                          patient with severe systemic disease. After reviewing                         the risks and benefits, the patient was deemed in                         satisfactory condition to undergo the procedure. The                         anesthesia plan was to use monitored anesthesia care                         (MAC). Immediately prior to administration of                         medications, the patient was re-assessed for adequacy                         to receive sedatives. The heart rate, respiratory                         rate, oxygen saturations, blood pressure, adequacy of                         pulmonary ventilation, and response to care were                         monitored throughout the procedure. The physical                         status of the patient was re-assessed after the                         procedure.                        After obtaining informed consent, the endoscope was                         passed under direct vision. Throughout the procedure,                         the patient's blood pressure, pulse, and oxygen                         saturations were monitored continuously. The Endoscope                         was introduced through the mouth, and advanced to the                         second part of duodenum. The upper GI endoscopy was  technically difficult and complex due to ineffective                         sedation. The patient tolerated the procedure well. Findings:      A small hiatal hernia was present.      The exam of the esophagus was otherwise normal.      The entire examined stomach was normal.      The examined duodenum was normal. Impression:            - Small hiatal hernia.                        - Normal stomach.                        - Normal examined duodenum.                        - No specimens collected. Recommendation:        - Discharge patient to home.                        - Resume previous  diet.                        - Continue present medications.                        - Return to referring physician as previously                         scheduled. Procedure Code(s):     --- Professional ---                        [phone], Esophagogastroduodenoscopy, flexible,                         transoral; diagnostic, including collection of                         specimen(s) by brushing or washing, when performed                         (separate procedure) Diagnosis Code(s):     --- Professional ---                        K44.9, Diaphragmatic hernia without obstruction or                         gangrene                        R13.10, Dysphagia, unspecified                        K21.9, Gastro-esophageal reflux disease without                         esophagitis CPT copyright 2022 American Medical Association. All rights reserved. The codes documented in this report are preliminary and upon coder review may  be revised to meet current compliance requirements. Andrey Farmer MD, MD  11/09/2022 10:15:40 AM Number of Addenda: 0 Note Initiated On: 11/09/2022 9:26 AM Estimated Blood Loss:  Estimated blood loss: none.      [facility]

## 2022-11-09 NOTE — Anesthesia Postprocedure Evaluation (Signed)
Anesthesia Post Note  Patient: Brian Howell  Procedure(s) Performed: COLONOSCOPY WITH PROPOFOL ESOPHAGOGASTRODUODENOSCOPY (EGD) WITH PROPOFOL  Patient location during evaluation: Endoscopy Anesthesia Type: General Level of consciousness: awake and alert Pain management: pain level controlled Vital Signs Assessment: post-procedure vital signs reviewed and stable Respiratory status: spontaneous breathing, nonlabored ventilation, respiratory function stable and patient connected to nasal cannula oxygen Cardiovascular status: blood pressure returned to baseline and stable Postop Assessment: no apparent nausea or vomiting Anesthetic complications: no   No notable events documented.   Last Vitals:  Vitals:   11/09/22 1019 11/09/22 1028  BP:  118/71  Pulse:    Resp:    Temp: (!) 36.3 C   SpO2:      Last Pain:  Vitals:   11/09/22 1028  TempSrc:   PainSc: 0-No pain                 Ilene Qua

## 2022-11-09 NOTE — Transfer of Care (Signed)
Immediate Anesthesia Transfer of Care Note  Patient: Brian Howell  Procedure(s) Performed: COLONOSCOPY WITH PROPOFOL ESOPHAGOGASTRODUODENOSCOPY (EGD) WITH PROPOFOL  Patient Location: PACU and Endoscopy Unit  Anesthesia Type:General  Level of Consciousness: sedated  Airway & Oxygen Therapy: Patient Spontanous Breathing and Patient connected to face mask oxygen  Post-op Assessment: Report given to RN and Post -op Vital signs reviewed and stable  Post vital signs: Reviewed and stable  Last Vitals:  Vitals Value Taken Time  BP 100/61 11/09/22 1019  Temp    Pulse 63 11/09/22 1020  Resp 11 11/09/22 1020  SpO2 98 % 11/09/22 1020  Vitals shown include unvalidated device data.  Last Pain:  Vitals:   11/09/22 0815  TempSrc: Temporal  PainSc: 0-No pain         Complications: No notable events documented.

## 2022-11-09 NOTE — Op Note (Signed)
[facility] Patient Name: Brian Howell Procedure Date: 11/09/2022 9:25 AM MRN: [id number] Account #: [id number] Date of Birth: 1970-11-28 Admit Type: Outpatient Age: 52 Room: [facility] ENDO ROOM 3 Gender: Male Note Status: Finalized Instrument Name: Colonoscope [id number] Procedure:             Colonoscopy Indications:           Positive Cologuard test Providers:             Andrey Farmer MD, MD Referring MD:          Leonie Douglas. Doy Hutching, MD (Referring MD) Medicines:             Monitored Anesthesia Care Complications:         No immediate complications. Estimated blood loss:                         Minimal. Procedure:             Pre-Anesthesia Assessment:                        - Prior to the procedure, a History and Physical was                         performed, and patient medications and allergies were                         reviewed. The patient is competent. The risks and                         benefits of the procedure and the sedation options and                         risks were discussed with the patient. All questions                         were answered and informed consent was obtained.                         Patient identification and proposed procedure were                         verified by the physician, the nurse, the                         anesthesiologist, the anesthetist and the technician                         in the endoscopy suite. Mental Status Examination:                         alert and oriented. Airway Examination: normal                         oropharyngeal airway and neck mobility. Respiratory                         Examination: clear to auscultation. CV Examination:  normal. Prophylactic Antibiotics: The patient does not                         require prophylactic antibiotics. Prior                         Anticoagulants: The patient has taken no anticoagulant                          or antiplatelet agents. ASA Grade Assessment: III - A                         patient with severe systemic disease. After reviewing                         the risks and benefits, the patient was deemed in                         satisfactory condition to undergo the procedure. The                         anesthesia plan was to use monitored anesthesia care                         (MAC). Immediately prior to administration of                         medications, the patient was re-assessed for adequacy                         to receive sedatives. The heart rate, respiratory                         rate, oxygen saturations, blood pressure, adequacy of                         pulmonary ventilation, and response to care were                         monitored throughout the procedure. The physical                         status of the patient was re-assessed after the                         procedure.                        After obtaining informed consent, the colonoscope was                         passed under direct vision. Throughout the procedure,                         the patient's blood pressure, pulse, and oxygen                         saturations were monitored continuously. The  Colonoscope was introduced through the anus and                         advanced to the the terminal ileum. The colonoscopy                         was performed without difficulty. The patient                         tolerated the procedure well. The quality of the bowel                         preparation was good. The terminal ileum, ileocecal                         valve, appendiceal orifice, and rectum were                         photographed. Findings:      The perianal and digital rectal examinations were normal.      The terminal ileum appeared normal.      A few small-mouthed diverticula were found in the sigmoid colon and       ascending colon.      Normal mucosa  was found in the entire colon. Biopsies for histology were       taken with a cold forceps from the entire colon for evaluation of       microscopic colitis.      Internal hemorrhoids were found during retroflexion. The hemorrhoids       were Grade I (internal hemorrhoids that do not prolapse).      The exam was otherwise without abnormality on direct and retroflexion       views. Impression:            - The examined portion of the ileum was normal.                        - Diverticulosis in the sigmoid colon and in the                         ascending colon.                        - Normal mucosa in the entire examined colon. Biopsied.                        - Internal hemorrhoids.                        - The examination was otherwise normal on direct and                         retroflexion views. Recommendation:        - Discharge patient to home.                        - Resume previous diet.                        - Continue present medications.                        -  Await pathology results.                        - Repeat colonoscopy in 10 years for screening                         purposes.                        - Return to referring physician as previously                         scheduled. Procedure Code(s):     --- Professional ---                        [phone], Colonoscopy, flexible; with biopsy, single or                         multiple Diagnosis Code(s):     --- Professional ---                        K64.0, First degree hemorrhoids                        R19.5, Other fecal abnormalities                        K57.30, Diverticulosis of large intestine without                         perforation or abscess without bleeding CPT copyright 2022 [employer]. All rights reserved. The codes documented in this report are preliminary and upon coder review may  be revised to meet current compliance requirements. Andrey Farmer MD, MD 11/09/2022 10:19:29  AM Number of Addenda: 0 Note Initiated On: 11/09/2022 9:25 AM Scope Withdrawal Time: 0 hours 9 minutes 8 seconds  Total Procedure Duration: 0 hours 12 minutes 20 seconds  Estimated Blood Loss:  Estimated blood loss was minimal.      [facility]

## 2022-11-09 NOTE — Interval H&P Note (Signed)
History and Physical Interval Note:  11/09/2022 9:31 AM  Brian Howell  has presented today for surgery, with the diagnosis of + Cologuard Dysphagia Gerd.  The various methods of treatment have been discussed with the patient and family. After consideration of risks, benefits and other options for treatment, the patient has consented to  Procedure(s): COLONOSCOPY WITH PROPOFOL (N/A) ESOPHAGOGASTRODUODENOSCOPY (EGD) WITH PROPOFOL (N/A) as a surgical intervention.  The patient's history has been reviewed, patient examined, no change in status, stable for surgery.  I have reviewed the patient's chart and labs.  Questions were answered to the patient's satisfaction.     [name]  Ok to proceed with EGD/Colonoscopy

## 2022-11-09 NOTE — Anesthesia Procedure Notes (Signed)
Procedure Name: General with mask airway Date/Time: 11/09/2022 9:56 AM  Performed by: Hilbert Odor, CRNAPre-anesthesia Checklist: Patient identified, Emergency Drugs available, Suction available, Patient being monitored and Timeout performed Patient Re-evaluated:Patient Re-evaluated prior to induction Oxygen Delivery Method: Simple face mask Induction Type: IV induction Comments: POM

## 2022-11-09 NOTE — Anesthesia Preprocedure Evaluation (Addendum)
Anesthesia Evaluation  Patient identified by MRN, date of birth, ID band Patient awake    Reviewed: Allergy & Precautions, NPO status , Patient's Chart, lab work & pertinent test results  History of Anesthesia Complications Negative for: history of anesthetic complications  Airway Mallampati: III  TM Distance: >3 FB Neck ROM: full    Dental  (+) Edentulous Upper, Edentulous Lower   Pulmonary sleep apnea and Continuous Positive Airway Pressure Ventilation    Pulmonary exam normal        Cardiovascular hypertension, Normal cardiovascular exam     Neuro/Psych  Headaches PSYCHIATRIC DISORDERS Anxiety      Neuromuscular disease    GI/Hepatic Neg liver ROS,GERD  ,,Dysphagia    Endo/Other  diabetes, Insulin Dependent  Morbid obesity  Renal/GU negative Renal ROS  negative genitourinary   Musculoskeletal  (+) Arthritis ,    Abdominal   Peds  Hematology negative hematology ROS (+)   Anesthesia Other Findings Past Medical History: No date: Anxiety No date: Arthritis No date: Diabetes mellitus, type II ([place]) 10/16/2020: Dysplastic nevus     Comment:  Left post. thigh. Moderate to severe atypia, lateral               margin involved.  excised 11/19/20 No date: Headache No date: Hypertension No date: Memory deficit No date: Neuropathy No date: Sleep apnea     Comment:  cpap  Past Surgical History: No date: CHOLECYSTECTOMY 06/17/2020: KNEE ARTHROPLASTY; Right     Comment:  Procedure: COMPUTER ASSISTED TOTAL KNEE ARTHROPLASTY;                Surgeon: Dereck Leep, MD;  Location: [facility] ORS;                Service: Orthopedics;  Laterality: Right; No date: KNEE ARTHROSCOPY; Right     Comment:  meniscus 01/16/2022: LEFT HEART CATH AND CORONARY ANGIOGRAPHY; N/A     Comment:  Procedure: LEFT HEART CATH AND CORONARY ANGIOGRAPHY;                Surgeon: Andrez Grime, MD;  Location: [place] [facility];  Service: Cardiovascular;  Laterality:               N/A;  BMI    Body Mass Index: 40.12 kg/m      Reproductive/Obstetrics negative OB ROS                             Anesthesia Physical Anesthesia Plan  ASA: 3  Anesthesia Plan: General   Post-op Pain Management: Minimal or no pain anticipated   Induction: Intravenous  PONV Risk Score and Plan: Propofol infusion and TIVA  Airway Management Planned: Natural Airway and Nasal Cannula  Additional Equipment:   Intra-op Plan:   Post-operative Plan:   Informed Consent: I have reviewed the patients History and Physical, chart, labs and discussed the procedure including the risks, benefits and alternatives for the proposed anesthesia with the patient or authorized representative who has indicated his/her understanding and acceptance.     Dental Advisory Given  Plan Discussed with: Anesthesiologist, CRNA and Surgeon  Anesthesia Plan Comments: (Patient consented for risks of anesthesia including but not limited to:  - adverse reactions to medications - risk of airway placement if required - damage to eyes, teeth, lips or other oral mucosa - nerve damage  due to positioning  - sore throat or hoarseness - Damage to heart, brain, nerves, lungs, other parts of body or loss of life  Patient voiced understanding.)       Anesthesia Quick Evaluation

## 2022-11-10 ENCOUNTER — Encounter: Payer: Self-pay | Admitting: Gastroenterology

## 2022-11-10 LAB — SURGICAL PATHOLOGY

## 2022-11-14 NOTE — Progress Notes (Unsigned)
BH MD/PA/NP OP Progress Note  11/16/2022 5:25 PM Brian Howell  MRN:  [id number]  Chief Complaint:  Chief Complaint  Patient presents with   Follow-up   HPI:  - he was started on ozempic since the last visit.  - he was referred to rheumatology for evaluation of suspected psoriatic arthritis. "Given his history a period of watchful waiting is warranted, with a relatively low threshold to trial a weak immunosuppressant such as sulfasalazine but insufficient evidence to justify starting any immunotherapy today. We will obtain serologies and imaging to investigate further."  This is a follow-up appointment for depression and dissociative disorder.  He is interviewed by himself at the first part of the interview.  He states that there has been no change since the last visit.  He just takes an additional dose of duloxetine.  He states that he is in a deep depression, and crying.  He feels he is done with anything, although he denies any SI plan or intent.  He feels like he has no life due to his memory loss.  He thinks his wife is with him just because of his program.  Although he cares for her, it gets nowhere.  He is concerned about the manage.  He states that she does not want him to hug or kiss. He talks about an episode of him not having recollection of anything the next day, which is depressing to him.  He hears a voice of his sister, who is deceased.  He hears things at times.  It has been frustrating.  Although he sleeps 8 hours, he does not want to get up. The patient has mood symptoms as in PHQ-9/GAD-7.   His wife joins the interview with the patient consent.  There has been no change since the last visit.  She thinks he has been agitated all day.  He walks around, feeling angry (Christ states that he felt like his blood was hot for no reason).  She denies any safety concern to himself or others.  She verbalized understanding about the medication change, and agrees with plans.    Visit  Diagnosis:    ICD-10-CM   1. 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 I have reviewed the history. No updates at this time.     Past Medical History:  Past Medical History:  Diagnosis Date   Anxiety    Arthritis    Diabetes mellitus, type II ([place])    Dysplastic nevus 10/16/2020   Left post. thigh. Moderate to severe atypia, lateral margin involved.  excised 11/19/20   Headache    Hypertension    Memory deficit    Neuropathy    Sleep apnea    cpap    Past Surgical History:  Procedure Laterality Date   CHOLECYSTECTOMY     COLONOSCOPY WITH PROPOFOL N/A 11/09/2022   Procedure: COLONOSCOPY WITH PROPOFOL;  Surgeon: [name], MD;  Location: [facility];  Service: Endoscopy;  Laterality: N/A;   ESOPHAGOGASTRODUODENOSCOPY (EGD) WITH PROPOFOL N/A 11/09/2022   Procedure: ESOPHAGOGASTRODUODENOSCOPY (EGD) WITH PROPOFOL;  Surgeon: [name], MD;  Location: [facility] END[facility]COPY;  Service: Endoscopy;  Laterality: N/A;   KNEE ARTHROPLASTY Right 06/17/2020   Procedure: COMPUTER ASSISTED TOTAL KNEE ARTHROPLASTY;  Surgeon: Dereck Leep, MD;  Location: [facility] ORS;  Service: Orthopedics;  Laterality: Right;   KNEE  ARTHROSCOPY Right    meniscus   LEFT HEART CATH AND CORONARY ANGIOGRAPHY N/A 01/16/2022   Procedure: LEFT HEART CATH AND CORONARY ANGIOGRAPHY;  Surgeon: Andrez Grime, MD;  Location: [facility];  Service: Cardiovascular;  Laterality: N/A;    Family Psychiatric History: Please see initial evaluation for full details. I have reviewed the history. No updates at this time.     Family History:  Family History  Problem Relation Age of Onset   Anxiety disorder Mother    Depression Mother    Diabetes Mother    Heart attack Father    Stroke Father    Schizophrenia Sister    Bipolar disorder Sister     Diabetes Sister    Diabetes Sister    Stroke Sister    Heart attack Sister    Diabetes Brother     Social History:  Social History   Socioeconomic History   Marital status: Married    Spouse name: Brian Howell   Number of children: 4   Years of education: Not on file   Highest education level: Associate degree: occupational, Hotel manager, or vocational program  Occupational History   Not on file  Tobacco Use   Smoking status: Never   Smokeless tobacco: Never  Vaping Use   Vaping Use: Never used  Substance and Sexual Activity   Alcohol use: No    Alcohol/week: 0.0 standard drinks of alcohol   Drug use: No   Sexual activity: Not Currently  Other Topics Concern   Not on file  Social History Narrative   Not on file   Social Determinants of Health   Financial Resource Strain: Not on file  Food Insecurity: Not on file  Transportation Needs: Not on file  Physical Activity: Not on file  Stress: Not on file  Social Connections: Not on file    Allergies:  Allergies  Allergen Reactions   Ace Inhibitors Swelling    Lips and face   Gabapentin Rash    Metabolic Disorder Labs: No results found for: "HGBA1C", "MPG" No results found for: "PROLACTIN" No results found for: "CHOL", "TRIG", "HDL", "CHOLHDL", "VLDL", "LDLCALC" No results found for: "TSH"  Therapeutic Level Labs: No results found for: "LITHIUM" No results found for: "VALPROATE" No results found for: "CBMZ"  Current Medications: Current Outpatient Medications  Medication Sig Dispense Refill   aspirin EC 81 MG tablet Take 1 tablet (81 mg total) by mouth daily. Swallow whole. 150 tablet 2   Cyanocobalamin (B-12 COMPLIANCE INJECTION) 1000 MCG/ML KIT Inject 1,000 mcg as directed every 30 (thirty) days.     DULoxetine (CYMBALTA) 60 MG capsule Take 60 mg by mouth daily.     ferrous sulfate 325 (65 FE) MG tablet Take by mouth.     furosemide (LASIX) 20 MG tablet Take by mouth.     HYDROCORTISONE, TOPICAL, (TEXACORT)  2.5 % SOLN Apply 1 application topically 3 (three) times a week. Apply to rash in beard area 3 nights a week 30 mL 3   hydrOXYzine (ATARAX/VISTARIL) 25 MG tablet Take 25 mg by mouth 3 (three) times daily.      insulin NPH Human (NOVOLIN N) 100 UNIT/ML injection Inject 35-75 Units into the skin See admin instructions. Inject 75 units in the morning and 35 units at night     insulin regular (NOVOLIN R) 100 units/mL injection Inject 35-55 Units into the skin See admin instructions. Inject 55 units with breakfast, 35 units at lunch time, and 55 units with dinner  Sliding scale     Insulin Syringe-Needle U-100 (INSULIN SYRINGE .3CC/29GX1/2") 29G X 1/2" 0.3 ML MISC      ketoconazole (NIZORAL) 2 % shampoo Apply 1 application topically 3 (three) times a week. Wash scalp, axilla and beard area 3 times weekly let sit 5 minutes and rinse out 120 mL 3   losartan (COZAAR) 25 MG tablet Take 25 mg by mouth 2 (two) times daily.     Melatonin 10 MG CAPS Take 10 mg by mouth at bedtime.     metFORMIN (GLUCOPHAGE) 1000 MG tablet Take 1,000 mg by mouth 2 (two) times daily.     mupirocin ointment (BACTROBAN) 2 % Apply 1 application topically daily. Qd to excision site 22 g 0   oxyCODONE (OXY IR/ROXICODONE) 5 MG immediate release tablet Take 1 tablet (5 mg total) by mouth every 4 (four) hours as needed for moderate pain (pain score 4-6). 30 tablet 0   traZODone (DESYREL) 100 MG tablet TAKE 1 TABLET BY MOUTH AT BEDTIME AS NEEDED FOR SLEEP 90 tablet 1   urea (CARMOL) 40 % CREA Apply to the neck QD. 198 g 1   enoxaparin (LOVENOX) 40 MG/0.4ML injection Inject 0.4 mLs (40 mg total) into the skin daily for 14 days. 5.6 mL 0   No current facility-administered medications for this visit.     Musculoskeletal: Strength & Muscle Tone: within normal limits Gait & Station: normal Patient leans: N/A  Psychiatric Specialty Exam: Review of Systems  Psychiatric/Behavioral:  Positive for decreased concentration, dysphoric mood,  sleep disturbance and suicidal ideas. Negative for agitation, behavioral problems, confusion, hallucinations and self-injury. The patient is nervous/anxious. The patient is not hyperactive.   All other systems reviewed and are negative.   Blood pressure 124/72, pulse 60, temperature 97.8 F (36.6 C), temperature source Skin, height 5\' 10"  (1.778 m), weight 282 lb 6.4 oz (128.1 kg).Body mass index is 40.52 kg/m.  General Appearance: Fairly Groomed  Eye Contact:  Good  Speech:  Clear and Coherent  Volume:  Normal  Mood:  Depressed  Affect:  Appropriate, Congruent, and Tearful  Thought Process:  Coherent  Orientation:  Full (Time, Place, and Person)  Thought Content: Logical   Suicidal Thoughts:  Yes.  without intent/plan  Homicidal Thoughts:  No  Memory:  Immediate;   Good  Judgement:  Good  Insight:  Fair  Psychomotor Activity:  Normal  Concentration:  Concentration: Good and Attention Span: Good  Recall:  Good  Fund of Knowledge: Good  Language: Good  Akathisia:  No  Handed:  Right  AIMS (if indicated): not done  Assets:  Communication Skills Desire for Improvement  ADL's:  Intact  Cognition: WNL  Sleep:   hypersomnia   Screenings: GAD-7    Physiological scientist Office Visit from 11/16/2022 in [place] Office Visit from 09/14/2022 in [place]  Total GAD-7 Score 16 14      PHQ2-9    [place] Office Visit from 11/16/2022 in [place] Office Visit from 09/14/2022 in [place]  PHQ-2 Total Score 6 6  PHQ-9 Total Score 24 22      [place] Office Visit from 11/16/2022 in [place] Admission (Discharged) from 11/09/2022 in [place] Office Visit from 09/14/2022 in [place] Error: Q3, 4, or 5 should not be populated when  Q2 is No No Risk Error: Q3, 4, or 5 should not be populated when Q2 is No        Assessment and Plan:  Billy Force is a 52 y.o. year old male with a history of depression, anxiety, dissociative disorder, hypertension, hyperlipidemia, sleep apnea on CPAP, who is referred for depression.   1. Moderate episode of recurrent major depressive disorder ([place]) 2. Dissociative disorder Acute stressors include: n/a  Other stressors include: demoralization due to memory loss/dissociative disorder, unemployment   History: no history of significant mood symptoms prior to having head injury in 2007 (he accidentally fell from a truck and hit his head on the concrete).  He was reportedly evaluated during [facility] admission, and was diagnosed with dissociative cognitive disorder.  He was last seen at [facility] psychiatry in 2021.  Exam is notable for restricted affect, and he denies any changes in his mood since uptitration of duloxetine.  Will cut back the dose of duloxetine, and add Abilify to target depression, irritability and hallucinations.  Discussed potential risk of QTc prolongation, EPS and drowsiness.  Will obtain records for more collaterals.  Noted that both the patient and his wife denies any emotional trauma history.  Will continue hydroxyzine as needed for anxiety.   # Insomnia/hypersomnia - had a sleep evaluation in the past year, and has been using CPAP regularly.  Unchanged.  Will continue trazodone prn for insomnia while it is used judiciously given his history of hypersomnia. Noted that he is on topiramate, but the indication for its use is unknown as it was prescribed by his PCP.  Both the patient and his wife is advised to reach out to his PCP to inquire possible tapering off this medication given its negative impact on his cognition.    Plan Decrease duloxetine 60 mg daily (limited benefit from 90 mg) Obtain EKG  Start Abilify 2 mg at  night after reviewing EKG Continue hydroxyzine 25 mg daily as needed for anxiety Continue Trazodone 100 mg at night as needed for insomnia Next appointment: 5/14 at 4:30 for 30 mins, IP Obtain record from [facility]. [facility] - TSH wnl in 08/2022. Currently on vitamin B12, last checked in 2023 - on topiramte 50 mg daily (by PCP, unknown indication), - on ozempic   Past trials: sertraline, lexapro, fluoxetine, mirtazapine, Abilify   The patient demonstrates the following risk factors for suicide: Chronic risk factors for suicide include: psychiatric disorder of depression . Acute risk factors for suicide include: unemployment. Protective factors for this patient include: positive social support and hope for the future. Considering these factors, the overall suicide risk at this point appears to be low. Patient is appropriate for outpatient follow up. The wife verifies that he has no access to guns at home.    Collaboration of Care: Collaboration of Care: Other reviewed notes in Epic  Patient/Guardian was advised Release of Information must be obtained prior to any record release in order to collaborate their care with an outside provider. Patient/Guardian was advised if they have not already done so to contact the registration department to sign all necessary forms in order for [place] to release information regarding their care.   Consent: Patient/Guardian gives verbal consent for treatment and assignment of benefits for services provided during this visit. Patient/Guardian expressed understanding and agreed to proceed.    Norman Clay, MD 11/16/2022, 5:25 PM

## 2022-11-16 ENCOUNTER — Ambulatory Visit (INDEPENDENT_AMBULATORY_CARE_PROVIDER_SITE_OTHER): Payer: Medicare Other | Admitting: Psychiatry

## 2022-11-16 ENCOUNTER — Encounter: Payer: Self-pay | Admitting: Psychiatry

## 2022-11-16 VITALS — BP 124/72 | HR 60 | Temp 97.8°F | Ht 70.0 in | Wt 282.4 lb

## 2022-11-16 DIAGNOSIS — F331 Major depressive disorder, recurrent, moderate: Secondary | ICD-10-CM | POA: Diagnosis not present

## 2022-11-16 DIAGNOSIS — F449 Dissociative and conversion disorder, unspecified: Secondary | ICD-10-CM | POA: Diagnosis not present

## 2022-11-16 NOTE — Patient Instructions (Signed)
Decrease duloxetine 60 mg daily Obtain EKG  Start Abilify 2 mg at night after reviewing EKG Continue hydroxyzine 25 mg daily as needed for anxiety Continue Trazodone 100 mg at night as needed for insomnia Next appointment: 5/14 at 4:30

## 2023-01-03 NOTE — Progress Notes (Deleted)
BH MD/PA/NP OP Progress Note  01/03/2023 11:09 AM Brian Howell  MRN:  [id number]  Chief Complaint: No chief complaint on file.  HPI: *** Visit Diagnosis: No diagnosis found.  Past Psychiatric History: Please see initial evaluation for full details. I have reviewed the history. No updates at this time.     Past Medical History:  Past Medical History:  Diagnosis Date   Anxiety    Arthritis    Diabetes mellitus, type II (HCC)    Dysplastic nevus 10/16/2020   Left post. thigh. Moderate to severe atypia, lateral margin involved.  excised 11/19/20   Headache    Hypertension    Memory deficit    Neuropathy    Sleep apnea    cpap    Past Surgical History:  Procedure Laterality Date   CHOLECYSTECTOMY     COLONOSCOPY WITH PROPOFOL N/A 11/09/2022   Procedure: COLONOSCOPY WITH PROPOFOL;  Surgeon: Regis Bill, MD;  Location: [facility] ENDOSCOPY;  Service: Endoscopy;  Laterality: N/A;   ESOPHAGOGASTRODUODENOSCOPY (EGD) WITH PROPOFOL N/A 11/09/2022   Procedure: ESOPHAGOGASTRODUODENOSCOPY (EGD) WITH PROPOFOL;  Surgeon: Regis Bill, MD;  Location: [facility] ENDOSCOPY;  Service: Endoscopy;  Laterality: N/A;   KNEE ARTHROPLASTY Right 06/17/2020   Procedure: COMPUTER ASSISTED TOTAL KNEE ARTHROPLASTY;  Surgeon: Donato Heinz, MD;  Location: [facility] ORS;  Service: Orthopedics;  Laterality: Right;   KNEE ARTHROSCOPY Right    meniscus   LEFT HEART CATH AND CORONARY ANGIOGRAPHY N/A 01/16/2022   Procedure: LEFT HEART CATH AND CORONARY ANGIOGRAPHY;  Surgeon: Armando Reichert, MD;  Location: [facility] INVASIVE CV LAB;  Service: Cardiovascular;  Laterality: N/A;    Family Psychiatric History: Please see initial evaluation for full details. I have reviewed the history. No updates at this time.     Family History:  Family History  Problem Relation Age of Onset   Anxiety disorder Mother    Depression Mother    Diabetes Mother    Heart attack Father    Stroke Father    Schizophrenia Sister     Bipolar disorder Sister    Diabetes Sister    Diabetes Sister    Stroke Sister    Heart attack Sister    Diabetes Brother     Social History:  Social History   Socioeconomic History   Marital status: Married    Spouse name: Efraim Kaufmann   Number of children: 4   Years of education: Not on file   Highest education level: Associate degree: occupational, Scientist, product/process development, or vocational program  Occupational History   Not on file  Tobacco Use   Smoking status: Never   Smokeless tobacco: Never  Vaping Use   Vaping Use: Never used  Substance and Sexual Activity   Alcohol use: No    Alcohol/week: 0.0 standard drinks of alcohol   Drug use: No   Sexual activity: Not Currently  Other Topics Concern   Not on file  Social History Narrative   Not on file   Social Determinants of Health   Financial Resource Strain: Not on file  Food Insecurity: Not on file  Transportation Needs: Not on file  Physical Activity: Not on file  Stress: Not on file  Social Connections: Not on file    Allergies:  Allergies  Allergen Reactions   Ace Inhibitors Swelling    Lips and face   Gabapentin Rash    Metabolic Disorder Labs: No results found for: "HGBA1C", "MPG" No results found for: "PROLACTIN" No results found for: "CHOL", "TRIG", "HDL", "CHOLHDL", "  VLDL", "LDLCALC" No results found for: "TSH"  Therapeutic Level Labs: No results found for: "LITHIUM" No results found for: "VALPROATE" No results found for: "CBMZ"  Current Medications: Current Outpatient Medications  Medication Sig Dispense Refill   aspirin EC 81 MG tablet Take 1 tablet (81 mg total) by mouth daily. Swallow whole. 150 tablet 2   Cyanocobalamin (B-12 COMPLIANCE INJECTION) 1000 MCG/ML KIT Inject 1,000 mcg as directed every 30 (thirty) days.     DULoxetine (CYMBALTA) 60 MG capsule Take 60 mg by mouth daily.     enoxaparin (LOVENOX) 40 MG/0.4ML injection Inject 0.4 mLs (40 mg total) into the skin daily for 14 days. 5.6 mL 0    ferrous sulfate 325 (65 FE) MG tablet Take by mouth.     furosemide (LASIX) 20 MG tablet Take by mouth.     HYDROCORTISONE, TOPICAL, (TEXACORT) 2.5 % SOLN Apply 1 application topically 3 (three) times a week. Apply to rash in beard area 3 nights a week 30 mL 3   hydrOXYzine (ATARAX/VISTARIL) 25 MG tablet Take 25 mg by mouth 3 (three) times daily.      insulin NPH Human (NOVOLIN N) 100 UNIT/ML injection Inject 35-75 Units into the skin See admin instructions. Inject 75 units in the morning and 35 units at night     insulin regular (NOVOLIN R) 100 units/mL injection Inject 35-55 Units into the skin See admin instructions. Inject 55 units with breakfast, 35 units at lunch time, and 55 units with dinner  Sliding scale     Insulin Syringe-Needle U-100 (INSULIN SYRINGE .3CC/29GX1/2") 29G X 1/2" 0.3 ML MISC      ketoconazole (NIZORAL) 2 % shampoo Apply 1 application topically 3 (three) times a week. Wash scalp, axilla and beard area 3 times weekly let sit 5 minutes and rinse out 120 mL 3   losartan (COZAAR) 25 MG tablet Take 25 mg by mouth 2 (two) times daily.     Melatonin 10 MG CAPS Take 10 mg by mouth at bedtime.     metFORMIN (GLUCOPHAGE) 1000 MG tablet Take 1,000 mg by mouth 2 (two) times daily.     mupirocin ointment (BACTROBAN) 2 % Apply 1 application topically daily. Qd to excision site 22 g 0   oxyCODONE (OXY IR/ROXICODONE) 5 MG immediate release tablet Take 1 tablet (5 mg total) by mouth every 4 (four) hours as needed for moderate pain (pain score 4-6). 30 tablet 0   traZODone (DESYREL) 100 MG tablet TAKE 1 TABLET BY MOUTH AT BEDTIME AS NEEDED FOR SLEEP 90 tablet 1   urea (CARMOL) 40 % CREA Apply to the neck QD. 198 g 1   No current facility-administered medications for this visit.     Musculoskeletal: Strength & Muscle Tone: within normal limits Gait & Station: normal Patient leans: N/A  Psychiatric Specialty Exam: Review of Systems  There were no vitals taken for this visit.There is  no height or weight on file to calculate BMI.  General Appearance: {Appearance:22683}  Eye Contact:  {BHH EYE CONTACT:22684}  Speech:  Clear and Coherent  Volume:  Normal  Mood:  {BHH MOOD:22306}  Affect:  {Affect (PAA):22687}  Thought Process:  Coherent  Orientation:  Full (Time, Place, and Person)  Thought Content: Logical   Suicidal Thoughts:  {ST/HT (PAA):22692}  Homicidal Thoughts:  {ST/HT (PAA):22692}  Memory:  Immediate;   Good  Judgement:  {Judgement (PAA):22694}  Insight:  {Insight (PAA):22695}  Psychomotor Activity:  Normal  Concentration:  Concentration: Good and Attention Span: Good  Recall:  Good  Fund of Knowledge: Good  Language: Good  Akathisia:  No  Handed:  Right  AIMS (if indicated): not done  Assets:  Communication Skills Desire for Improvement  ADL's:  Intact  Cognition: WNL  Sleep:  {BHH GOOD/FAIR/POOR:22877}   Screenings: GAD-7    Flowsheet Row Office Visit from 11/16/2022 in [facility] Office Visit from 09/14/2022 in [facility]  Total GAD-7 Score 16 14      PHQ2-9    Flowsheet Row Office Visit from 11/16/2022 in [facility] Office Visit from 09/14/2022 in [facility]  PHQ-2 Total Score 6 6  PHQ-9 Total Score 24 22      Flowsheet Row Office Visit from 11/16/2022 in [facility] Admission (Discharged) from 11/09/2022 in [facility] Office Visit from 09/14/2022 in [facility]  C-SSRS RISK CATEGORY Error: Q3, 4, or 5 should not be populated when Q2 is No No Risk Error: Q3, 4, or 5 should not be populated when Q2 is No        Assessment and Plan:  Brian Howell is a 52 y.o. year old male with a history of depression, anxiety, dissociative disorder, hypertension,  hyperlipidemia, sleep apnea on CPAP, who is referred for depression.    1. Moderate episode of recurrent major depressive disorder (HCC) 2. Dissociative disorder Acute stressors include: n/a  Other stressors include: demoralization due to memory loss/dissociative disorder, unemployment   History: no history of significant mood symptoms prior to having head injury in 2007 (he accidentally fell from a truck and hit his head on the concrete).  He was reportedly evaluated during [facility] admission, and was diagnosed with dissociative cognitive disorder.  He was last seen at [facility] psychiatry in 2021.  Exam is notable for restricted affect, and he denies any changes in his mood since uptitration of duloxetine.  Will cut back the dose of duloxetine, and add Abilify to target depression, irritability and hallucinations.  Discussed potential risk of QTc prolongation, EPS and drowsiness.  Will obtain records for more collaterals.  Noted that both the patient and his wife denies any emotional trauma history.  Will continue hydroxyzine as needed for anxiety.    # Insomnia/hypersomnia - had a sleep evaluation in the past year, and has been using CPAP regularly.  Unchanged.  Will continue trazodone prn for insomnia while it is used judiciously given his history of hypersomnia. Noted that he is on topiramate, but the indication for its use is unknown as it was prescribed by his PCP.  Both the patient and his wife is advised to reach out to his PCP to inquire possible tapering off this medication given its negative impact on his cognition.    Plan Decrease duloxetine 60 mg daily (limited benefit from 90 mg) Obtain EKG  Start Abilify 2 mg at night after reviewing EKG Continue hydroxyzine 25 mg daily as needed for anxiety Continue Trazodone 100 mg at night as needed for insomnia Next appointment: 5/14 at 4:30 for 30 mins, IP Obtain record from [facility]. [facility] - TSH wnl in 08/2022. Currently on vitamin B12, last checked in  2023 - on topiramte 50 mg daily (by PCP, unknown indication), - on ozempic    Past trials: sertraline, lexapro, fluoxetine, mirtazapine, Abilify   The patient demonstrates the following risk factors for suicide: Chronic risk factors for suicide include: psychiatric  disorder of depression . Acute risk factors for suicide include: unemployment. Protective factors for this patient include: positive social support and hope for the future. Considering these factors, the overall suicide risk at this point appears to be low. Patient is appropriate for outpatient follow up. The wife verifies that he has no access to guns at home.    Collaboration of Care: Collaboration of Care: {BH OP Collaboration of Care:21014065}  Patient/Guardian was advised Release of Information must be obtained prior to any record release in order to collaborate their care with an outside provider. Patient/Guardian was advised if they have not already done so to contact the registration department to sign all necessary forms in order for [place] to release information regarding their care.   Consent: Patient/Guardian gives verbal consent for treatment and assignment of benefits for services provided during this visit. Patient/Guardian expressed understanding and agreed to proceed.    Neysa Hotter, MD 01/03/2023, 11:09 AM

## 2023-01-05 ENCOUNTER — Ambulatory Visit: Payer: Medicare Other | Admitting: Psychiatry

## 2023-04-05 ENCOUNTER — Other Ambulatory Visit: Payer: Self-pay | Admitting: Psychiatry

## 2023-04-20 ENCOUNTER — Other Ambulatory Visit: Payer: Self-pay | Admitting: Surgery

## 2023-04-20 DIAGNOSIS — M7582 Other shoulder lesions, left shoulder: Secondary | ICD-10-CM

## 2023-04-28 ENCOUNTER — Ambulatory Visit
Admission: RE | Admit: 2023-04-28 | Discharge: 2023-04-28 | Disposition: A | Discharge status: Home / Self Care | Payer: Medicare Other | Source: Ambulatory Visit | Attending: Surgery | Admitting: Surgery

## 2023-04-28 DIAGNOSIS — M7582 Other shoulder lesions, left shoulder: Secondary | ICD-10-CM | POA: Diagnosis present

## 2024-01-26 ENCOUNTER — Other Ambulatory Visit: Payer: Self-pay | Admitting: Internal Medicine

## 2024-01-26 DIAGNOSIS — R519 Headache, unspecified: Secondary | ICD-10-CM

## 2024-01-31 ENCOUNTER — Ambulatory Visit
Admission: RE | Admit: 2024-01-31 | Discharge: 2024-01-31 | Disposition: A | Discharge status: Home / Self Care | Source: Ambulatory Visit | Attending: Internal Medicine | Admitting: Internal Medicine

## 2024-01-31 DIAGNOSIS — R519 Headache, unspecified: Secondary | ICD-10-CM | POA: Diagnosis present

## 2024-03-15 ENCOUNTER — Other Ambulatory Visit: Payer: Self-pay | Admitting: Orthopedic Surgery

## 2024-03-15 DIAGNOSIS — M25511 Pain in right shoulder: Secondary | ICD-10-CM

## 2024-03-27 ENCOUNTER — Ambulatory Visit
Admission: RE | Admit: 2024-03-27 | Discharge: 2024-03-27 | Disposition: A | Discharge status: Home / Self Care | Source: Ambulatory Visit | Attending: Orthopedic Surgery | Admitting: Orthopedic Surgery

## 2024-03-27 DIAGNOSIS — M25511 Pain in right shoulder: Secondary | ICD-10-CM | POA: Insufficient documentation

## 2024-04-05 ENCOUNTER — Other Ambulatory Visit: Payer: Self-pay | Admitting: Nurse Practitioner

## 2024-04-05 ENCOUNTER — Ambulatory Visit
Admission: RE | Admit: 2024-04-05 | Discharge: 2024-04-05 | Disposition: A | Discharge status: Home / Self Care | Source: Ambulatory Visit | Attending: Nurse Practitioner | Admitting: Nurse Practitioner

## 2024-04-05 DIAGNOSIS — M79604 Pain in right leg: Secondary | ICD-10-CM | POA: Diagnosis present

## 2024-04-05 DIAGNOSIS — R600 Localized edema: Secondary | ICD-10-CM

## 2024-04-05 DIAGNOSIS — M79605 Pain in left leg: Secondary | ICD-10-CM | POA: Insufficient documentation

## 2024-04-26 NOTE — Progress Notes (Signed)
 Brian Howell is a  53 y.o. male who presents for  CHIEF COMPLAINT Chief Complaint  Patient presents with  . Follow-up  . Hypertension  . Hyperlipidemia  . Chronic Pain  . Diabetes    Subjective: History of Present Illness  Pt in NAD. Obese with BMI>40. HTN stable on meds. Has HLD on statin, DM on insulin  and chronic pain on Percocet. Weight stable. Now with mouth pain. No fever. Denies Cp or SOB. No palpitations. No change in bowels or bladder.    Past Medical History:  Diagnosis Date  . Anxiety   . Arthritis   . Asthma, unspecified asthma severity, unspecified whether complicated, unspecified whether persistent (HHS-HCC) 2008  . Coronary artery disease 01/28/2022   30% mid LAD, 50% D1  . Depression   . 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 Hyperlipidemia 1998  . Hypertension   . Migraine   . Myocardial infarction (CMS/HHS-HCC)   . Myocarditis, viral (HHS-HCC) 10/25/2014  . Neuropathy   . Obesity (BMI 30-39.9), unspecified   . Obstructive sleep apnea   . Pain syndrome, chronic 09/06/2015  . Painful diabetic neuropathy (CMS/HHS-HCC) 09/06/2015  . Type 2 diabetes mellitus (CMS/HHS-HCC)    Patient Active Problem List  Diagnosis  . Dissociative disorder  . Uncontrolled type 2 diabetes mellitus with complication, with long-term current use of insulin   . Generalized OA  . Pain syndrome, chronic  . Painful diabetic neuropathy (CMS/HHS-HCC)  . HTN, goal below 140/90  . Chronic pain of both knees  . Obstructive sleep apnea  . Type 2 diabetes mellitus (CMS/HHS-HCC)  . Hyperlipidemia, acquired  . Chest pain  . Myocarditis, viral (HHS-HCC)  . Recurrent bronchospasm  . Dizziness  . SOB (shortness of breath)  . MDD (major depressive disorder), recurrent, in partial remission ()  . Primary osteoarthritis of left knee  . Uncontrolled type 2 diabetes mellitus with hyperglycemia, with long-term current use of insulin  (CMS/HHS-HCC)  . DM type 2 with diabetic peripheral  neuropathy (CMS/HHS-HCC)  . Diabetes mellitus type 2 in obese (CMS/HHS-HCC)  . Chronic bilateral low back pain with bilateral sciatica  . Lumbar disc herniation with radiculopathy  . Foraminal stenosis of lumbar region  . Adjustment disorder with anxiety  . Cellulitis and abscess of digit  . Erectile dysfunction due to arterial insufficiency  . Functional neurologic complaint  . Headache, unspecified  . Idiopathic myocarditis (HHS-HCC)  . Recurrent oral aphthae  . Status post total right knee replacement  . Coronary artery disease  . Rotator cuff tendinitis, left  . Cervical spondylosis    Past Surgical History:  Procedure Laterality Date  .  Right total knee arthroplasty using computer-assisted navigation  06/17/2020   Dr Mardee  . Colon @ [facility]  11/09/2022   Path on colonoscopy was unremarkable. Can repeat in 10 years/CTL  . EGD @ [facility]  11/09/2022   Normal examined EGD/Repeat PRN/CTL  . CHOLECYSTECTOMY    . KNEE ARTHROSCOPY       Current Outpatient Medications:  .  ACCU-CHEK GUIDE GLUCOSE METER Misc, as directed, Disp: 1 each, Rfl: 0 .  aspirin  81 MG chewable tablet, Take 1 tablet (81 mg total) by mouth once daily, Disp: 30 tablet, Rfl: 11 .  blood glucose diagnostic test strip, 1 each (1 strip total) 4 (four) times daily Use as instructed., Disp: 400 each, Rfl: 3 .  blood glucose diagnostic test strip, 1 each (1 strip total) 4 (four) times daily Use as instructed., Disp: 400 each, Rfl: 1 .  blood glucose diagnostic test strip, 1 each (1 strip total) 4 (four) times daily Use as instructed., Disp: 400 each, Rfl: 1 .  blood glucose meter kit, as directed, Disp: 1 each, Rfl: 0 .  clotrimazole-betamethasone (LOTRISONE) 1-0.05 % cream, Apply topically 2 (two) times daily, Disp: 45 g, Rfl: 1 .  cyanocobalamin (VITAMIN B12) 1,000 mcg/mL injection, INJECT INTRAMUSCULARLY 1 ML ONCE MONTHLY AS DIRECTED (DISCARD 28  DAYS AFTER FIRST USE), Disp: 3 mL, Rfl: 1 .  DULoxetine  (CYMBALTA ) 60  MG DR capsule, TAKE 1 CAPSULE BY MOUTH ONCE  DAILY, Disp: 90 capsule, Rfl: 3 .  ferrous sulfate  325 (65 FE) MG tablet, Take 325 mg by mouth 2 (two) times daily with meals, Disp: , Rfl:  .  FUROsemide  (LASIX ) 20 MG tablet, Take 1 tablet (20 mg total) by mouth once daily as needed for Edema, Disp: 100 tablet, Rfl: 2 .  hydrOXYzine  (ATARAX ) 25 MG tablet, Take 1 tablet (25 mg total) by mouth 3 (three) times daily as needed for Itching, Disp: 300 tablet, Rfl: 1 .  insulin  glargine U-300 conc (TOUJEO MAX U-300 SOLOSTAR) 300 unit/mL (3 mL) InPn, Inject 110 Units subcutaneously once daily (Patient taking differently: Inject 60 Units subcutaneously 2 (two) times daily Starting after he runs out of other insulin ), Disp: 36 mL, Rfl: 3 .  insulin  LISPRO (HUMALOG KWIKPEN) pen injector (concentration 100 units/mL), Inject up to 150 units daily in divided doses, as directed, Disp: 135 mL, Rfl: 3 .  lancets, Use 1 each 4 (four) times daily Use as instructed., Disp: 400 each, Rfl: 3 .  lancets, Use 1 each 4 (four) times daily Use as instructed., Disp: 400 each, Rfl: 1 .  losartan  (COZAAR ) 50 MG tablet, TAKE 1 TABLET BY MOUTH ONCE  DAILY, Disp: 100 tablet, Rfl: 2 .  melatonin 10 mg Cap, Take 1 capsule by mouth nightly as needed   , Disp: , Rfl:  .  meloxicam (MOBIC) 15 MG tablet, Take 1 tablet (15 mg total) by mouth once daily, Disp: 30 tablet, Rfl: 3 .  metFORMIN  (GLUCOPHAGE ) 1000 MG tablet, TAKE 1 TABLET BY MOUTH TWICE  DAILY WITH MEALS, Disp: 200 tablet, Rfl: 2 .  oxyCODONE -acetaminophen  (PERCOCET) 10-325 mg tablet, Take 1 tablet by mouth every 6 (six) hours as needed for Pain, Disp: 120 tablet, Rfl: 0 .  pen needle, diabetic 31 gauge x 3/16 needle, Use 4 (four) times daily, Disp: 400 each, Rfl: 3 .  pregabalin (LYRICA) 100 MG capsule, Take 1 capsule (100 mg total) by mouth 2 (two) times daily, Disp: 60 capsule, Rfl: 11 .  rosuvastatin (CRESTOR) 20 MG tablet, Take 1 tablet (20 mg total) by mouth once daily, Disp:  100 tablet, Rfl: 3 .  tadalafiL  (CIALIS ) 20 MG tablet, Take 1 tablet (20 mg total) by mouth once daily as needed for Erectile Dysfunction for up to 30 days, Disp: 10 tablet, Rfl: 1 .  tamsulosin (FLOMAX) 0.4 mg capsule, Take 1 capsule (0.4 mg total) by mouth once daily Take 30 minutes after same meal each day., Disp: 30 capsule, Rfl: 11 .  tiZANidine  (ZANAFLEX ) 4 MG tablet, TAKE 1 TABLET BY MOUTH EVERY 8  HOURS AS NEEDED, Disp: 90 tablet, Rfl: 1 .  topiramate (TOPAMAX) 50 MG tablet, TAKE 1 TABLET BY MOUTH TWICE  DAILY, Disp: 200 tablet, Rfl: 2 .  traZODone  (DESYREL ) 50 MG tablet, Take 1.5 tablets (75 mg total) by mouth at bedtime, Disp: 135 tablet, Rfl: 3  Ace inhibitors and Gabapentin  Social  History   Socioeconomic History  . Marital status: Married    Spouse name: Melissa  . Number of children: 4  . Years of education: 14  Occupational History  . Occupation: Disabled  Tobacco Use  . Smoking status: Never  . Smokeless tobacco: Never  Vaping Use  . Vaping status: Never Used  Substance and Sexual Activity  . Alcohol  use: No  . Drug use: No  . Sexual activity: Not Currently    Partners: Female   Social Drivers of Health   Financial Resource Strain: High Risk (04/05/2024)   Overall Financial Resource Strain (CARDIA)   . Difficulty of Paying Living Expenses: Hard  Food Insecurity: Food Insecurity Present (04/05/2024)   Hunger Vital Sign   . Worried About Programme Researcher, Broadcasting/film/video in the Last Year: Sometimes true   . Ran Out of Food in the Last Year: Sometimes true  Transportation Needs: No Transportation Needs (04/05/2024)   PRAPARE - Transportation   . Lack of Transportation (Medical): No   . Lack of Transportation (Non-Medical): No  Housing Stability: Low Risk  (04/05/2024)   Housing Stability Vital Sign   . Unable to Pay for Housing in the Last Year: No   . Number of Times Moved in the Last Year: 0   . Homeless in the Last Year: No    Family History  Problem Relation Name Age  of Onset  . Myocardial Infarction (Heart attack) Mother Orlean   . High blood pressure (Hypertension) Mother Orlean   . Stroke Mother Orlean   . Coronary Artery Disease (Blocked arteries around heart) Mother Orlean   . Diabetes Mother Orlean   . Diabetes type II Mother Orlean   . Myocardial Infarction (Heart attack) Father Watkins   . High blood pressure (Hypertension) Father Georgia   . Coronary Artery Disease (Blocked arteries around heart) Father Elgin   . Diabetes Sister Leeroy   . Stroke Sister Leeroy   . Cancer Sister Leeroy        ovarian  . Coronary Artery Disease (Blocked arteries around heart) Sister Leeroy   . Diabetes type II Sister Leeroy   . High blood pressure (Hypertension) Sister Leeroy   . Diabetes Brother Deward   . High blood pressure (Hypertension) Brother Deward   . Diabetes Maternal Grandmother Virginia    . Heart disease Maternal Grandmother Virginia    . High blood pressure (Hypertension) Maternal Grandmother Virginia    . Coronary Artery Disease (Blocked arteries around heart) Maternal Grandmother Virginia    . Diabetes Maternal Grandfather    . Heart disease Maternal Grandfather    . Myocardial Infarction (Heart attack) Maternal Grandfather    . High blood pressure (Hypertension) Maternal Grandfather    . Myocardial Infarction (Heart attack) Paternal Grandfather    . Myocardial Infarction (Heart attack) Maternal Aunt    . High blood pressure (Hypertension) Maternal Aunt    . Myocardial Infarction (Heart attack) Maternal Uncle Freddie   . High blood pressure (Hypertension) Maternal Uncle Freddie   . No Known Problems Paternal Grandmother    . Diabetes type II Sister Jenkins   . High blood pressure (Hypertension) Sister Jenkins     A comprehensive ROS was negative except for HPI  PE: BP 132/76   Pulse 74   Ht 177.8 cm (5' 10)   Wt (!) 129.3 kg (285 lb)   SpO2 98%   BMI 40.89 kg/m  General. Alert oriented x3   Eyes. Sclera and conjunctiva clear; pupils equal round  and reactive  to light and accommodation; extraocular movements intact Nose. Mucosa healthy without drainage or ulceration Oropharynx. No suspicious lesions Neck. No swelling, masses, stiffness, pain, limited movement, carotid pulses normal bilaterally, thyroid normal size, no masses palpated.  No bruits Lungs. Respirations unlabored; clear to auscultation bilaterally Back. No spinal deformity Cardiovascular. Heart regular rate and rhythm without murmurs, gallops, or rubs Abdomen. Soft; non tender; non distended; normoactive bowel sounds; no masses or organomegaly Lymph Nodes. No significant cervical, supraclavicular, axillary or inguinal lymphadenopathy noted Musculoskeletal. No deformities; no active joint inflammation Extremities. Normal, no edema Pulses. Dorsalis pedis palpable and symmetric bilaterally Neurologic. Alert and oriented; speech intact; face symmetrical; moves all extremities well  Initial consult on 04/05/2024  Component Date Value Ref Range Status  . Vent Rate (bpm) 04/05/2024 53   Preliminary  . QRS Interval (msec) 04/05/2024 172   Preliminary  . QT Interval (msec) 04/05/2024 484   Preliminary  . QTc (msec) 04/05/2024 454   Preliminary  . Glucose 04/05/2024 228 (H)  70 - 110 mg/dL Final  . Sodium 91/86/7974 138  136 - 145 mmol/L Final  . Potassium 04/05/2024 4.1  3.6 - 5.1 mmol/L Final  . Chloride 04/05/2024 99  97 - 109 mmol/L Final  . Carbon Dioxide (CO2) 04/05/2024 34.4 (H)  22.0 - 32.0 mmol/L Final  . Calcium 04/05/2024 9.6  8.7 - 10.3 mg/dL Final  . Urea  Nitrogen (BUN) 04/05/2024 14  7 - 25 mg/dL Final  . Creatinine 91/86/7974 0.8  0.7 - 1.3 mg/dL Final  . Glomerular Filtration Rate (eGFR) 04/05/2024 105  >60 mL/min/1.73sq m Final  . BUN/Crea Ratio 04/05/2024 17.5  6.0 - 20.0 Final  . Anion Gap w/K 04/05/2024 8.7  6.0 - 16.0 Final  . BNP (Brain Natriuretic Peptide) 04/05/2024 16.0  <=100.0 pg/mL Final  Initial consult on 03/15/2024  Component Date Value Ref  Range Status  . Vitamin D, 25-Hydroxy - [facility] 04/05/2024 31.6  30.0 - 100.0 ng/mL Final  Office Visit on 03/07/2024  Component Date Value Ref Range Status  . WBC (White Blood Cell Count) 03/07/2024 6.2  4.1 - 10.2 10^3/uL Final  . RBC (Red Blood Cell Count) 03/07/2024 4.46 (L)  4.69 - 6.13 10^6/uL Final  . Hemoglobin 03/07/2024 12.6 (L)  14.1 - 18.1 gm/dL Final  . Hematocrit 92/84/7974 38.1 (L)  40.0 - 52.0 % Final  . MCV (Mean Corpuscular Volume) 03/07/2024 85.4  80.0 - 100.0 fl Final  . MCH (Mean Corpuscular Hemoglobin) 03/07/2024 28.3  27.0 - 31.2 pg Final  . MCHC (Mean Corpuscular Hemoglobin * 03/07/2024 33.1  32.0 - 36.0 gm/dL Final  . Platelet Count 03/07/2024 126 (L)  150 - 450 10^3/uL Final  . RDW-CV (Red Cell Distribution Widt* 03/07/2024 13.5  11.6 - 14.8 % Final  . MPV (Mean Platelet Volume) 03/07/2024 10.3  9.4 - 12.4 fl Final  . Neutrophils 03/07/2024 2.44  1.50 - 7.80 10^3/uL Final  . Lymphocytes 03/07/2024 2.48  1.00 - 3.60 10^3/uL Final  . Monocytes 03/07/2024 0.53  0.00 - 1.50 10^3/uL Final  . Eosinophils 03/07/2024 0.67 (H)  0.00 - 0.55 10^3/uL Final  . Basophils 03/07/2024 0.05  0.00 - 0.09 10^3/uL Final  . Neutrophil % 03/07/2024 39.5  32.0 - 70.0 % Final  . Lymphocyte % 03/07/2024 40.1  10.0 - 50.0 % Final  . Monocyte % 03/07/2024 8.6  4.0 - 13.0 % Final  . Eosinophil % 03/07/2024 10.8 (H)  1.0 - 5.0 % Final  . Basophil% 03/07/2024 0.8  0.0 -  2.0 % Final  . Immature Granulocyte % 03/07/2024 0.2  <=0.7 % Final  . Immature Granulocyte Count 03/07/2024 0.01  <=0.06 10^3/L Final  . Glucose 03/07/2024 259 (H)  70 - 110 mg/dL Final  . Sodium 92/84/7974 139  136 - 145 mmol/L Final  . Potassium 03/07/2024 4.8  3.6 - 5.1 mmol/L Final  . Chloride 03/07/2024 104  97 - 109 mmol/L Final  . Carbon Dioxide (CO2) 03/07/2024 32.5 (H)  22.0 - 32.0 mmol/L Final  . Urea  Nitrogen (BUN) 03/07/2024 12  7 - 25 mg/dL Final  . Creatinine 92/84/7974 0.8  0.7 - 1.3 mg/dL Final  .  Glomerular Filtration Rate (eGFR) 03/07/2024 105  >60 mL/min/1.73sq m Final  . Calcium 03/07/2024 8.9  8.7 - 10.3 mg/dL Final  . AST  92/84/7974 37  8 - 39 U/L Final  . ALT  03/07/2024 33  6 - 57 U/L Final  . Alk Phos (alkaline Phosphatase) 03/07/2024 73  34 - 104 U/L Final  . Albumin 03/07/2024 3.7  3.5 - 4.8 g/dL Final  . Bilirubin, Total 03/07/2024 0.5  0.3 - 1.2 mg/dL Final  . Protein, Total 03/07/2024 6.0 (L)  6.1 - 7.9 g/dL Final  . A/G Ratio 92/84/7974 1.6  1.0 - 5.0 gm/dL Final  Office Visit on 01/24/2024  Component Date Value Ref Range Status  . WBC (White Blood Cell Count) 01/24/2024 9.7  4.1 - 10.2 10^3/uL Final  . RBC (Red Blood Cell Count) 01/24/2024 4.90  4.69 - 6.13 10^6/uL Final  . Hemoglobin 01/24/2024 13.7 (L)  14.1 - 18.1 gm/dL Final  . Hematocrit 93/97/7974 40.6  40.0 - 52.0 % Final  . MCV (Mean Corpuscular Volume) 01/24/2024 82.9  80.0 - 100.0 fl Final  . MCH (Mean Corpuscular Hemoglobin) 01/24/2024 28.0  27.0 - 31.2 pg Final  . MCHC (Mean Corpuscular Hemoglobin * 01/24/2024 33.7  32.0 - 36.0 gm/dL Final  . Platelet Count 01/24/2024 142 (L)  150 - 450 10^3/uL Final  . RDW-CV (Red Cell Distribution Widt* 01/24/2024 13.2  11.6 - 14.8 % Final  . MPV (Mean Platelet Volume) 01/24/2024 9.7  9.4 - 12.4 fl Final  . Neutrophils 01/24/2024 4.77  1.50 - 7.80 10^3/uL Final  . Lymphocytes 01/24/2024 3.45  1.00 - 3.60 10^3/uL Final  . Monocytes 01/24/2024 0.90  0.00 - 1.50 10^3/uL Final  . Eosinophils 01/24/2024 0.54  0.00 - 0.55 10^3/uL Final  . Basophils 01/24/2024 0.05  0.00 - 0.09 10^3/uL Final  . Neutrophil % 01/24/2024 49.1  32.0 - 70.0 % Final  . Lymphocyte % 01/24/2024 35.5  10.0 - 50.0 % Final  . Monocyte % 01/24/2024 9.2  4.0 - 13.0 % Final  . Eosinophil % 01/24/2024 5.5 (H)  1.0 - 5.0 % Final  . Basophil% 01/24/2024 0.5  0.0 - 2.0 % Final  . Immature Granulocyte % 01/24/2024 0.2  <=0.7 % Final  . Immature Granulocyte Count 01/24/2024 0.02  <=0.06 10^3/L Final  .  Glucose 01/24/2024 208 (H)  70 - 110 mg/dL Final  . Sodium 93/97/7974 137  136 - 145 mmol/L Final  . Potassium 01/24/2024 4.3  3.6 - 5.1 mmol/L Final  . Chloride 01/24/2024 97  97 - 109 mmol/L Final  . Carbon Dioxide (CO2) 01/24/2024 31.7  22.0 - 32.0 mmol/L Final  . Urea  Nitrogen (BUN) 01/24/2024 15  7 - 25 mg/dL Final  . Creatinine 93/97/7974 0.9  0.7 - 1.3 mg/dL Final  . Glomerular Filtration Rate (eGFR) 01/24/2024 101  >60 mL/min/1.73sq  m Final  . Calcium 01/24/2024 9.6  8.7 - 10.3 mg/dL Final  . AST  93/97/7974 41 (H)  8 - 39 U/L Final  . ALT  01/24/2024 36  6 - 57 U/L Final  . Alk Phos (alkaline Phosphatase) 01/24/2024 77  34 - 104 U/L Final  . Albumin 01/24/2024 4.2  3.5 - 4.8 g/dL Final  . Bilirubin, Total 01/24/2024 0.7  0.3 - 1.2 mg/dL Final  . Protein, Total 01/24/2024 6.7  6.1 - 7.9 g/dL Final  . A/G Ratio 93/97/7974 1.7  1.0 - 5.0 gm/dL Final  . Sedimentation Rate-Automated 01/24/2024 7  0 - 20 mm/hr Final  . Thyroid Stimulating Hormone (TSH) 01/24/2024 2.658  0.450-5.330 uIU/ml uIU/mL Final  . Color 01/24/2024 Light Yellow  Colorless, Straw, Light Yellow, Yellow, Dark Yellow Final  . Clarity 01/24/2024 Clear  Clear Final  . Specific Gravity 01/24/2024 1.018  1.005 - 1.030 Final  . pH, Urine 01/24/2024 5.0  5.0 - 8.0 Final  . Protein, Urinalysis 01/24/2024 Negative  Negative mg/dL Final  . Glucose, Urinalysis 01/24/2024 Negative  Negative mg/dL Final  . Ketones, Urinalysis 01/24/2024 Negative  Negative mg/dL Final  . Blood, Urinalysis 01/24/2024 Negative  Negative Final  . Nitrite, Urinalysis 01/24/2024 Negative  Negative Final  . Leukocyte Esterase, Urinalysis 01/24/2024 Negative  Negative Final  . Bilirubin, Urinalysis 01/24/2024 Negative  Negative Final  . Urobilinogen, Urinalysis 01/24/2024 0.2  0.2 - 1.0 mg/dL Final  . WBC, UA 93/97/7974 2  <=5 /hpf Final  . Red Blood Cells, Urinalysis 01/24/2024 0  <=3 /hpf Final  . Bacteria, Urinalysis 01/24/2024 0-5  0 - 5 /hpf  Final  . Squamous Epithelial Cells, Urinaly* 01/24/2024 0  /hpf Final  . Hemoglobin A1C 01/24/2024 9.3 (H)  4.2 - 5.6 % Final  . Average Blood Glucose (Calc) 01/24/2024 220  mg/dL Final  Office Visit on 09/08/2023  Component Date Value Ref Range Status  . Cholesterol, Total 09/08/2023 129  100 - 200 mg/dL Final  . Triglyceride 98/84/7974 109  35 - 199 mg/dL Final  . HDL (High Density Lipoprotein) Cho* 09/08/2023 45.8  29.0 - 71.0 mg/dL Final  . LDL Calculated 09/08/2023 61  0 - 130 mg/dL Final  . VLDL Cholesterol 09/08/2023 22  mg/dL Final  . Cholesterol/HDL Ratio 09/08/2023 2.8   Final  . WBC (White Blood Cell Count) 09/08/2023 9.6  4.1 - 10.2 10^3/uL Final  . RBC (Red Blood Cell Count) 09/08/2023 4.86  4.69 - 6.13 10^6/uL Final  . Hemoglobin 09/08/2023 14.0 (L)  14.1 - 18.1 gm/dL Final  . Hematocrit 98/84/7974 40.6  40.0 - 52.0 % Final  . MCV (Mean Corpuscular Volume) 09/08/2023 83.5  80.0 - 100.0 fl Final  . MCH (Mean Corpuscular Hemoglobin) 09/08/2023 28.8  27.0 - 31.2 pg Final  . MCHC (Mean Corpuscular Hemoglobin * 09/08/2023 34.5  32.0 - 36.0 gm/dL Final  . Platelet Count 09/08/2023 158  150 - 450 10^3/uL Final  . RDW-CV (Red Cell Distribution Widt* 09/08/2023 13.0  11.6 - 14.8 % Final  . MPV (Mean Platelet Volume) 09/08/2023 9.9  9.4 - 12.4 fl Final  . Neutrophils 09/08/2023 4.69  1.50 - 7.80 10^3/uL Final  . Lymphocytes 09/08/2023 3.70 (H)  1.00 - 3.60 10^3/uL Final  . Monocytes 09/08/2023 0.68  0.00 - 1.50 10^3/uL Final  . Eosinophils 09/08/2023 0.43  0.00 - 0.55 10^3/uL Final  . Basophils 09/08/2023 0.04  0.00 - 0.09 10^3/uL Final  . Neutrophil % 09/08/2023 49.1  32.0 -  70.0 % Final  . Lymphocyte % 09/08/2023 38.7  10.0 - 50.0 % Final  . Monocyte % 09/08/2023 7.1  4.0 - 13.0 % Final  . Eosinophil % 09/08/2023 4.5  1.0 - 5.0 % Final  . Basophil% 09/08/2023 0.4  0.0 - 2.0 % Final  . Immature Granulocyte % 09/08/2023 0.2  <=0.7 % Final  . Immature Granulocyte Count 09/08/2023  0.02  <=0.06 10^3/L Final  . Glucose 09/08/2023 242 (H)  70 - 110 mg/dL Final  . Sodium 98/84/7974 137  136 - 145 mmol/L Final  . Potassium 09/08/2023 4.7  3.6 - 5.1 mmol/L Final  . Chloride 09/08/2023 101  97 - 109 mmol/L Final  . Carbon Dioxide (CO2) 09/08/2023 31.1  22.0 - 32.0 mmol/L Final  . Urea  Nitrogen (BUN) 09/08/2023 9  7 - 25 mg/dL Final  . Creatinine 98/84/7974 0.7  0.7 - 1.3 mg/dL Final  . Glomerular Filtration Rate (eGFR) 09/08/2023 109  >60 mL/min/1.73sq m Final  . Calcium 09/08/2023 9.5  8.7 - 10.3 mg/dL Final  . AST  98/84/7974 31  8 - 39 U/L Final  . ALT  09/08/2023 46  6 - 57 U/L Final  . Alk Phos (alkaline Phosphatase) 09/08/2023 76  34 - 104 U/L Final  . Albumin 09/08/2023 4.1  3.5 - 4.8 g/dL Final  . Bilirubin, Total 09/08/2023 0.5  0.3 - 1.2 mg/dL Final  . Protein, Total 09/08/2023 6.9  6.1 - 7.9 g/dL Final  . A/G Ratio 98/84/7974 1.5  1.0 - 5.0 gm/dL Final  . PSA (Prostate Specific Antigen), T* 09/08/2023 0.38  0.10 - 4.00 ng/mL Final  . Thyroid Stimulating Hormone (TSH) 09/08/2023 1.546  0.450-5.330 uIU/ml uIU/mL Final  . Color 09/08/2023 Yellow  Colorless, Straw, Light Yellow, Yellow, Dark Yellow Final  . Clarity 09/08/2023 Clear  Clear Final  . Specific Gravity 09/08/2023 1.026  1.005 - 1.030 Final  . pH, Urine 09/08/2023 5.5  5.0 - 8.0 Final  . Protein, Urinalysis 09/08/2023 Trace (!)  Negative mg/dL Final  . Glucose, Urinalysis 09/08/2023 4+ (!)  Negative mg/dL Final  . Ketones, Urinalysis 09/08/2023 Negative  Negative mg/dL Final  . Blood, Urinalysis 09/08/2023 Negative  Negative Final  . Nitrite, Urinalysis 09/08/2023 Negative  Negative Final  . Leukocyte Esterase, Urinalysis 09/08/2023 Negative  Negative Final  . Bilirubin, Urinalysis 09/08/2023 Negative  Negative Final  . Urobilinogen, Urinalysis 09/08/2023 0.2  0.2 - 1.0 mg/dL Final  . WBC, UA 98/84/7974 <1  <=5 /hpf Final  . Red Blood Cells, Urinalysis 09/08/2023 1  <=3 /hpf Final  . Bacteria,  Urinalysis 09/08/2023 0-5  0 - 5 /hpf Final  . Squamous Epithelial Cells, Urinaly* 09/08/2023 0  /hpf Final  . Hemoglobin A1C 09/08/2023 8.5 (H)  4.2 - 5.6 % Final  . Average Blood Glucose (Calc) 09/08/2023 197  mg/dL Final  Office Visit on 05/06/2023  Component Date Value Ref Range Status  . WBC (White Blood Cell Count) 05/06/2023 9.5  4.1 - 10.2 10^3/uL Final  . RBC (Red Blood Cell Count) 05/06/2023 4.68 (L)  4.69 - 6.13 10^6/uL Final  . Hemoglobin 05/06/2023 13.4 (L)  14.1 - 18.1 gm/dL Final  . Hematocrit 90/87/7975 40.6  40.0 - 52.0 % Final  . MCV (Mean Corpuscular Volume) 05/06/2023 86.8  80.0 - 100.0 fl Final  . MCH (Mean Corpuscular Hemoglobin) 05/06/2023 28.6  27.0 - 31.2 pg Final  . MCHC (Mean Corpuscular Hemoglobin * 05/06/2023 33.0  32.0 - 36.0 gm/dL Final  . Platelet Count  05/06/2023 181  150 - 450 10^3/uL Final  . RDW-CV (Red Cell Distribution Widt* 05/06/2023 13.2  11.6 - 14.8 % Final  . MPV (Mean Platelet Volume) 05/06/2023 9.8  9.4 - 12.4 fl Final  . Neutrophils 05/06/2023 4.17  1.50 - 7.80 10^3/uL Final  . Lymphocytes 05/06/2023 4.32 (H)  1.00 - 3.60 10^3/uL Final  . Monocytes 05/06/2023 0.72  0.00 - 1.50 10^3/uL Final  . Eosinophils 05/06/2023 0.27  0.00 - 0.55 10^3/uL Final  . Basophils 05/06/2023 0.04  0.00 - 0.09 10^3/uL Final  . Neutrophil % 05/06/2023 43.8  32.0 - 70.0 % Final  . Lymphocyte % 05/06/2023 45.3  10.0 - 50.0 % Final  . Monocyte % 05/06/2023 7.6  4.0 - 13.0 % Final  . Eosinophil % 05/06/2023 2.8  1.0 - 5.0 % Final  . Basophil% 05/06/2023 0.4  0.0 - 2.0 % Final  . Immature Granulocyte % 05/06/2023 0.1  <=0.7 % Final  . Immature Granulocyte Count 05/06/2023 0.01  <=0.06 10^3/L Final  . Glucose 05/06/2023 258 (H)  70 - 110 mg/dL Final  . Sodium 90/87/7975 139  136 - 145 mmol/L Final  . Potassium 05/06/2023 4.2  3.6 - 5.1 mmol/L Final  . Chloride 05/06/2023 100  97 - 109 mmol/L Final  . Carbon Dioxide (CO2) 05/06/2023 33.7 (H)  22.0 - 32.0 mmol/L Final   . Urea  Nitrogen (BUN) 05/06/2023 11  7 - 25 mg/dL Final  . Creatinine 90/87/7975 0.7  0.7 - 1.3 mg/dL Final  . Glomerular Filtration Rate (eGFR) 05/06/2023 110  >60 mL/min/1.73sq m Final  . Calcium 05/06/2023 9.2  8.7 - 10.3 mg/dL Final  . AST  90/87/7975 20  8 - 39 U/L Final  . ALT  05/06/2023 23  6 - 57 U/L Final  . Alk Phos (alkaline Phosphatase) 05/06/2023 83  34 - 104 U/L Final  . Albumin 05/06/2023 3.8  3.5 - 4.8 g/dL Final  . Bilirubin, Total 05/06/2023 0.5  0.3 - 1.2 mg/dL Final  . Protein, Total 05/06/2023 6.1  6.1 - 7.9 g/dL Final  . A/G Ratio 90/87/7975 1.7  1.0 - 5.0 gm/dL Final  . Thyroid Stimulating Hormone (TSH) 05/06/2023 4.334  0.450-5.330 uIU/ml uIU/mL Final  . Color 05/06/2023 Yellow  Colorless, Straw, Light Yellow, Yellow, Dark Yellow Final  . Clarity 05/06/2023 Clear  Clear Final  . Specific Gravity 05/06/2023 1.025  1.005 - 1.030 Final  . pH, Urine 05/06/2023 5.0  5.0 - 8.0 Final  . Protein, Urinalysis 05/06/2023 Trace (!)  Negative mg/dL Final  . Glucose, Urinalysis 05/06/2023 4+ (!)  Negative mg/dL Final  . Ketones, Urinalysis 05/06/2023 Negative  Negative mg/dL Final  . Blood, Urinalysis 05/06/2023 Negative  Negative Final  . Nitrite, Urinalysis 05/06/2023 Negative  Negative Final  . Leukocyte Esterase, Urinalysis 05/06/2023 Negative  Negative Final  . Bilirubin, Urinalysis 05/06/2023 Negative  Negative Final  . Urobilinogen, Urinalysis 05/06/2023 0.2  0.2 - 1.0 mg/dL Final  . WBC, UA 90/87/7975 2  <=5 /hpf Final  . Red Blood Cells, Urinalysis 05/06/2023 1  <=3 /hpf Final  . Bacteria, Urinalysis 05/06/2023 0-5  0 - 5 /hpf Final  . Squamous Epithelial Cells, Urinaly* 05/06/2023 0  /hpf Final  . Vitamin B12 05/06/2023 604  >300 pg/mL Final   DIAGNOSIS: HTN, goal below 140/90  (primary encounter diagnosis)  Uncontrolled type 2 diabetes mellitus with hyperglycemia, with long-term current use of insulin  (CMS/HHS-HCC)  Hyperlipidemia, acquired  Pain syndrome,  chronic   PLAN: Chronic pain- meds  refilled Mouth pain- Mycelex ordered HTN- stable, same meds HLD- diet/exercise/statin, labs today DM- diet/exercise/water, same meds, labs today RTC 3 mo, sooner if needed     Attestation Statement:   I personally performed the service. (TP)  Reyes JONETTA Costa, MD, MD

## 2024-05-25 NOTE — Progress Notes (Signed)
 Brian Howell is a  53 y.o. male who presents for  CHIEF COMPLAINT Chief Complaint  Patient presents with  . Follow-up  . Hypertension  . Hyperlipidemia  . Diabetes  . Chronic Pain    Subjective: History of Present Illness  Pt in NAD. Obese with BMI>40. HTN stable on meds. Has HLD on statin, DM on insulin  and chronic pain on Percocet. Weight stable. Feels tired. No fever or HA's. Some CP at rest(chronic). Denies SOB, some palpitations. No change in bowels or bladder.    Past Medical History:  Diagnosis Date  . Anxiety   . Arthritis   . Asthma, unspecified asthma severity, unspecified whether complicated, unspecified whether persistent (HHS-HCC) 2008  . Coronary artery disease 01/28/2022   30% mid LAD, 50% D1  . Depression   . 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 Hyperlipidemia 1998  . Hypertension   . Migraine   . Myocardial infarction (CMS/HHS-HCC)   . Myocarditis, viral (HHS-HCC) 10/25/2014  . Neuropathy   . Obesity (BMI 30-39.9), unspecified   . Obstructive sleep apnea   . Pain syndrome, chronic 09/06/2015  . Painful diabetic neuropathy (CMS/HHS-HCC) 09/06/2015  . Type 2 diabetes mellitus (CMS/HHS-HCC)    Patient Active Problem List  Diagnosis  . Dissociative disorder  . Uncontrolled type 2 diabetes mellitus with complication, with long-term current use of insulin   . Generalized OA  . Pain syndrome, chronic  . Painful diabetic neuropathy (CMS/HHS-HCC)  . HTN, goal below 140/90  . Chronic pain of both knees  . Obstructive sleep apnea  . Type 2 diabetes mellitus (CMS/HHS-HCC)  . Hyperlipidemia, acquired  . Chest pain  . Myocarditis, viral (HHS-HCC)  . Recurrent bronchospasm  . Dizziness  . SOB (shortness of breath)  . MDD (major depressive disorder), recurrent, in partial remission ()  . Primary osteoarthritis of left knee  . Uncontrolled type 2 diabetes mellitus with hyperglycemia, with long-term current use of insulin  (CMS/HHS-HCC)  . DM type 2 with  diabetic peripheral neuropathy (CMS/HHS-HCC)  . Diabetes mellitus type 2 in obese (CMS/HHS-HCC)  . Chronic bilateral low back pain with bilateral sciatica  . Lumbar disc herniation with radiculopathy  . Foraminal stenosis of lumbar region  . Adjustment disorder with anxiety  . Cellulitis and abscess of digit  . Erectile dysfunction due to arterial insufficiency  . Functional neurologic complaint  . Headache, unspecified  . Idiopathic myocarditis (HHS-HCC)  . Recurrent oral aphthae  . Status post total right knee replacement  . Coronary artery disease  . Rotator cuff tendinitis, left  . Cervical spondylosis    Past Surgical History:  Procedure Laterality Date  .  Right total knee arthroplasty using computer-assisted navigation  06/17/2020   Dr Mardee  . Colon @ [facility]  11/09/2022   Path on colonoscopy was unremarkable. Can repeat in 10 years/CTL  . EGD @ [facility]  11/09/2022   Normal examined EGD/Repeat PRN/CTL  . CHOLECYSTECTOMY    . KNEE ARTHROSCOPY       Current Outpatient Medications:  .  ACCU-CHEK GUIDE GLUCOSE METER Misc, as directed, Disp: 1 each, Rfl: 0 .  aspirin  81 MG chewable tablet, Take 1 tablet (81 mg total) by mouth once daily, Disp: 30 tablet, Rfl: 11 .  blood glucose diagnostic test strip, 1 each (1 strip total) 4 (four) times daily Use as instructed., Disp: 400 each, Rfl: 1 .  blood glucose diagnostic test strip, 1 each (1 strip total) 4 (four) times daily Use as instructed., Disp: 400 each, Rfl: 1 .  blood glucose meter kit, as directed, Disp: 1 each, Rfl: 0 .  clindamycin (CLEOCIN T) 1 % topical gel, Apply topically 2 (two) times daily, Disp: 30 g, Rfl: 2 .  clotrimazole-betamethasone (LOTRISONE) 1-0.05 % cream, Apply topically 2 (two) times daily, Disp: 45 g, Rfl: 1 .  cyanocobalamin (VITAMIN B12) 1,000 mcg/mL injection, INJECT INTRAMUSCULARLY 1 ML ONCE MONTHLY AS DIRECTED (DISCARD 28  DAYS AFTER FIRST USE), Disp: 3 mL, Rfl: 1 .  DULoxetine  (CYMBALTA ) 60 MG DR  capsule, TAKE 1 CAPSULE BY MOUTH ONCE  DAILY, Disp: 90 capsule, Rfl: 3 .  ferrous sulfate  325 (65 FE) MG tablet, Take 325 mg by mouth 2 (two) times daily with meals, Disp: , Rfl:  .  FUROsemide  (LASIX ) 20 MG tablet, TAKE 1 TABLET BY MOUTH ONCE  DAILY AS NEEDED FOR EDEMA, Disp: 100 tablet, Rfl: 2 .  hydrOXYzine  (ATARAX ) 25 MG tablet, Take 1 tablet (25 mg total) by mouth 3 (three) times daily as needed for Itching, Disp: 300 tablet, Rfl: 1 .  insulin  glargine U-300 conc (TOUJEO MAX U-300 SOLOSTAR) 300 unit/mL (3 mL) InPn, Inject 120 Units subcutaneously at bedtime, Disp: 36 mL, Rfl: 3 .  insulin  LISPRO (HUMALOG KWIKPEN) pen injector (concentration 100 units/mL), Inject up to 150 units daily in divided doses, as directed, Disp: 135 mL, Rfl: 3 .  lancets, Use 1 each 4 (four) times daily Use as instructed., Disp: 400 each, Rfl: 1 .  losartan  (COZAAR ) 50 MG tablet, TAKE 1 TABLET BY MOUTH ONCE  DAILY, Disp: 100 tablet, Rfl: 2 .  melatonin 10 mg Cap, Take 1 capsule by mouth nightly as needed   , Disp: , Rfl:  .  meloxicam (MOBIC) 15 MG tablet, Take 1 tablet (15 mg total) by mouth once daily, Disp: 30 tablet, Rfl: 3 .  metFORMIN  (GLUCOPHAGE ) 1000 MG tablet, TAKE 1 TABLET BY MOUTH TWICE  DAILY WITH MEALS, Disp: 200 tablet, Rfl: 2 .  oxyCODONE -acetaminophen  (PERCOCET) 10-325 mg tablet, Take 1 tablet by mouth every 6 (six) hours as needed for Pain, Disp: 120 tablet, Rfl: 0 .  pregabalin (LYRICA) 100 MG capsule, Take 1 capsule (100 mg total) by mouth 2 (two) times daily, Disp: 60 capsule, Rfl: 11 .  rosuvastatin (CRESTOR) 20 MG tablet, TAKE 1 TABLET BY MOUTH ONCE  DAILY, Disp: 100 tablet, Rfl: 2 .  tamsulosin (FLOMAX) 0.4 mg capsule, Take 1 capsule (0.4 mg total) by mouth once daily Take 30 minutes after same meal each day., Disp: 30 capsule, Rfl: 11 .  tiZANidine  (ZANAFLEX ) 4 MG tablet, TAKE 1 TABLET BY MOUTH EVERY 8  HOURS AS NEEDED, Disp: 90 tablet, Rfl: 1 .  topiramate (TOPAMAX) 50 MG tablet, TAKE 1 TABLET BY  MOUTH TWICE  DAILY, Disp: 200 tablet, Rfl: 2 .  traZODone  (DESYREL ) 50 MG tablet, Take 1.5 tablets (75 mg total) by mouth at bedtime, Disp: 135 tablet, Rfl: 3 .  blood glucose diagnostic test strip, 1 each (1 strip total) 4 (four) times daily Use as instructed., Disp: 400 each, Rfl: 3 .  lancets, Use 1 each 4 (four) times daily Use as instructed., Disp: 400 each, Rfl: 3 .  pen needle, diabetic 31 gauge x 3/16 needle, Use 4 (four) times daily, Disp: 400 each, Rfl: 3 .  tadalafiL  (CIALIS ) 20 MG tablet, Take 1 tablet (20 mg total) by mouth once daily as needed for Erectile Dysfunction for up to 30 days, Disp: 10 tablet, Rfl: 1  Ace inhibitors and Gabapentin  Social History  Socioeconomic History  . Marital status: Married    Spouse name: Melissa  . Number of children: 4  . Years of education: 14  Occupational History  . Occupation: Disabled  Tobacco Use  . Smoking status: Never  . Smokeless tobacco: Never  Vaping Use  . Vaping status: Never Used  Substance and Sexual Activity  . Alcohol  use: No  . Drug use: No  . Sexual activity: Not Currently    Partners: Female   Social Drivers of Health   Financial Resource Strain: High Risk (04/05/2024)   Overall Financial Resource Strain (CARDIA)   . Difficulty of Paying Living Expenses: Hard  Food Insecurity: Food Insecurity Present (04/05/2024)   Hunger Vital Sign   . Worried About Programme Researcher, Broadcasting/film/video in the Last Year: Sometimes true   . Ran Out of Food in the Last Year: Sometimes true  Transportation Needs: No Transportation Needs (04/05/2024)   PRAPARE - Transportation   . Lack of Transportation (Medical): No   . Lack of Transportation (Non-Medical): No  Housing Stability: Low Risk  (04/05/2024)   Housing Stability Vital Sign   . Unable to Pay for Housing in the Last Year: No   . Number of Times Moved in the Last Year: 0   . Homeless in the Last Year: No    Family History  Problem Relation Name Age of Onset  . Myocardial  Infarction (Heart attack) Mother Orlean   . High blood pressure (Hypertension) Mother Orlean   . Stroke Mother Orlean   . Coronary Artery Disease (Blocked arteries around heart) Mother Orlean   . Diabetes Mother Orlean   . Diabetes type II Mother Orlean   . Myocardial Infarction (Heart attack) Father [place]   . High blood pressure (Hypertension) Father [place]   . Coronary Artery Disease (Blocked arteries around heart) Father [place]   . Diabetes Sister Leeroy   . Stroke Sister Leeroy   . Cancer Sister Leeroy        ovarian  . Coronary Artery Disease (Blocked arteries around heart) Sister Leeroy   . Diabetes type II Sister Leeroy   . High blood pressure (Hypertension) Sister Leeroy   . Diabetes Brother Deward   . High blood pressure (Hypertension) Brother Deward   . Diabetes Maternal Grandmother [place]    . Heart disease Maternal Grandmother Virginia    . High blood pressure (Hypertension) Maternal Grandmother Virginia    . Coronary Artery Disease (Blocked arteries around heart) Maternal Grandmother Virginia    . Diabetes Maternal Grandfather    . Heart disease Maternal Grandfather    . Myocardial Infarction (Heart attack) Maternal Grandfather    . High blood pressure (Hypertension) Maternal Grandfather    . Myocardial Infarction (Heart attack) Paternal Grandfather    . Myocardial Infarction (Heart attack) Maternal Aunt    . High blood pressure (Hypertension) Maternal Aunt    . Myocardial Infarction (Heart attack) Maternal Uncle Freddie   . High blood pressure (Hypertension) Maternal Uncle Freddie   . No Known Problems Paternal Grandmother    . Diabetes type II Sister Jenkins   . High blood pressure (Hypertension) Sister Jenkins     A comprehensive ROS was negative except for HPI  PE: BP 138/78   Pulse 65   Ht 177.8 cm (5' 10)   Wt (!) 129.3 kg (285 lb)   SpO2 98%   BMI 40.89 kg/m  The patient looks well today. He is in no distress. Neck is supple without adenopathy.  Carotids are 2+  bilaterally without bruits. Lungs are clear. Heart is regular rate and rhythm without murmurs, rubs, or gallops. Abdomen is soft, non tender, no masses or organomegaly. Lower extremities without obvious edema.   Office Visit on 04/26/2024  Component Date Value Ref Range Status  . WBC (White Blood Cell Count) 04/26/2024 9.4  4.1 - 10.2 10^3/uL Final  . RBC (Red Blood Cell Count) 04/26/2024 5.34  4.69 - 6.13 10^6/uL Final  . Hemoglobin 04/26/2024 15.1  14.1 - 18.1 gm/dL Final  . Hematocrit 90/96/7974 45.2  40.0 - 52.0 % Final  . MCV (Mean Corpuscular Volume) 04/26/2024 84.6  80.0 - 100.0 fl Final  . MCH (Mean Corpuscular Hemoglobin) 04/26/2024 28.3  27.0 - 31.2 pg Final  . MCHC (Mean Corpuscular Hemoglobin * 04/26/2024 33.4  32.0 - 36.0 gm/dL Final  . Platelet Count 04/26/2024 175  150 - 450 10^3/uL Final  . RDW-CV (Red Cell Distribution Widt* 04/26/2024 13.8  11.6 - 14.8 % Final  . MPV (Mean Platelet Volume) 04/26/2024 9.9  9.4 - 12.4 fl Final  . Neutrophils 04/26/2024 4.67  1.50 - 7.80 10^3/uL Final  . Lymphocytes 04/26/2024 3.18  1.00 - 3.60 10^3/uL Final  . Monocytes 04/26/2024 0.94  0.00 - 1.50 10^3/uL Final  . Eosinophils 04/26/2024 0.54  0.00 - 0.55 10^3/uL Final  . Basophils 04/26/2024 0.05  0.00 - 0.09 10^3/uL Final  . Neutrophil % 04/26/2024 49.8  32.0 - 70.0 % Final  . Lymphocyte % 04/26/2024 33.8  10.0 - 50.0 % Final  . Monocyte % 04/26/2024 10.0  4.0 - 13.0 % Final  . Eosinophil % 04/26/2024 5.7 (H)  1.0 - 5.0 % Final  . Basophil% 04/26/2024 0.5  0.0 - 2.0 % Final  . Immature Granulocyte % 04/26/2024 0.2  <=0.7 % Final  . Immature Granulocyte Count 04/26/2024 0.02  <=0.06 10^3/L Final  . Glucose 04/26/2024 89  70 - 110 mg/dL Final  . Sodium 90/96/7974 142  136 - 145 mmol/L Final  . Potassium 04/26/2024 4.1  3.6 - 5.1 mmol/L Final  . Chloride 04/26/2024 100  97 - 109 mmol/L Final  . Carbon Dioxide (CO2) 04/26/2024 31.1  22.0 - 32.0 mmol/L Final  . Urea  Nitrogen (BUN)  04/26/2024 10  7 - 25 mg/dL Final  . Creatinine 90/96/7974 0.8  0.7 - 1.3 mg/dL Final  . Glomerular Filtration Rate (eGFR) 04/26/2024 105  >60 mL/min/1.73sq m Final  . Calcium 04/26/2024 9.7  8.7 - 10.3 mg/dL Final  . AST  90/96/7974 29  8 - 39 U/L Final  . ALT  04/26/2024 26  6 - 57 U/L Final  . Alk Phos (alkaline Phosphatase) 04/26/2024 103  34 - 104 U/L Final  . Albumin 04/26/2024 4.4  3.5 - 4.8 g/dL Final  . Bilirubin, Total 04/26/2024 0.9  0.3 - 1.2 mg/dL Final  . Protein, Total 04/26/2024 7.2  6.1 - 7.9 g/dL Final  . A/G Ratio 90/96/7974 1.6  1.0 - 5.0 gm/dL Final  . Cholesterol, Total 04/26/2024 131  100 - 200 mg/dL Final  . Triglyceride 90/96/7974 111  35 - 199 mg/dL Final  . HDL (High Density Lipoprotein) Cho* 04/26/2024 47.5  29.0 - 71.0 mg/dL Final  . LDL Calculated 04/26/2024 61  0 - 130 mg/dL Final  . VLDL Cholesterol 04/26/2024 22  mg/dL Final  . Cholesterol/HDL Ratio 04/26/2024 2.8   Final  . Thyroid Stimulating Hormone (TSH) 04/26/2024 3.068  0.450-5.330 uIU/ml uIU/mL Final  . Color 04/26/2024 Yellow  Colorless, Straw, Light Yellow, Yellow,  Dark Yellow Final  . Clarity 04/26/2024 Clear  Clear Final  . Specific Gravity 04/26/2024 1.019  1.005 - 1.030 Final  . pH, Urine 04/26/2024 8.0  5.0 - 8.0 Final  . Protein, Urinalysis 04/26/2024 Trace (!)  Negative mg/dL Final  . Glucose, Urinalysis 04/26/2024 Negative  Negative mg/dL Final  . Ketones, Urinalysis 04/26/2024 Negative  Negative mg/dL Final  . Blood, Urinalysis 04/26/2024 Negative  Negative Final  . Nitrite, Urinalysis 04/26/2024 Negative  Negative Final  . Leukocyte Esterase, Urinalysis 04/26/2024 Negative  Negative Final  . Bilirubin, Urinalysis 04/26/2024 Negative  Negative Final  . Urobilinogen, Urinalysis 04/26/2024 4.0 (H)  0.2 - 1.0 mg/dL Final  . WBC, UA 90/96/7974 <1  <=5 /hpf Final  . Red Blood Cells, Urinalysis 04/26/2024 <1  <=3 /hpf Final  . Bacteria, Urinalysis 04/26/2024 0-5  0 - 5 /hpf Final  .  Squamous Epithelial Cells, Urinaly* 04/26/2024 0  /hpf Final  . Hemoglobin A1C 04/26/2024 9.1 (H)  4.2 - 5.6 % Final  . Average Blood Glucose (Calc) 04/26/2024 214  mg/dL Final  Initial consult on 04/05/2024  Component Date Value Ref Range Status  . Vent Rate (bpm) 04/05/2024 53   Final  . QRS Interval (msec) 04/05/2024 172   Final  . QT Interval (msec) 04/05/2024 484   Final  . QTc (msec) 04/05/2024 454   Final  . Glucose 04/05/2024 228 (H)  70 - 110 mg/dL Final  . Sodium 91/86/7974 138  136 - 145 mmol/L Final  . Potassium 04/05/2024 4.1  3.6 - 5.1 mmol/L Final  . Chloride 04/05/2024 99  97 - 109 mmol/L Final  . Carbon Dioxide (CO2) 04/05/2024 34.4 (H)  22.0 - 32.0 mmol/L Final  . Calcium 04/05/2024 9.6  8.7 - 10.3 mg/dL Final  . Urea  Nitrogen (BUN) 04/05/2024 14  7 - 25 mg/dL Final  . Creatinine 91/86/7974 0.8  0.7 - 1.3 mg/dL Final  . Glomerular Filtration Rate (eGFR) 04/05/2024 105  >60 mL/min/1.73sq m Final  . BUN/Crea Ratio 04/05/2024 17.5  6.0 - 20.0 Final  . Anion Gap w/K 04/05/2024 8.7  6.0 - 16.0 Final  . BNP (Brain Natriuretic Peptide) 04/05/2024 16.0  <=100.0 pg/mL Final  Initial consult on 03/15/2024  Component Date Value Ref Range Status  . Vitamin D, 25-Hydroxy - [facility] 04/05/2024 31.6  30.0 - 100.0 ng/mL Final  Office Visit on 03/07/2024  Component Date Value Ref Range Status  . WBC (White Blood Cell Count) 03/07/2024 6.2  4.1 - 10.2 10^3/uL Final  . RBC (Red Blood Cell Count) 03/07/2024 4.46 (L)  4.69 - 6.13 10^6/uL Final  . Hemoglobin 03/07/2024 12.6 (L)  14.1 - 18.1 gm/dL Final  . Hematocrit 92/84/7974 38.1 (L)  40.0 - 52.0 % Final  . MCV (Mean Corpuscular Volume) 03/07/2024 85.4  80.0 - 100.0 fl Final  . MCH (Mean Corpuscular Hemoglobin) 03/07/2024 28.3  27.0 - 31.2 pg Final  . MCHC (Mean Corpuscular Hemoglobin * 03/07/2024 33.1  32.0 - 36.0 gm/dL Final  . Platelet Count 03/07/2024 126 (L)  150 - 450 10^3/uL Final  . RDW-CV (Red Cell Distribution Widt*  03/07/2024 13.5  11.6 - 14.8 % Final  . MPV (Mean Platelet Volume) 03/07/2024 10.3  9.4 - 12.4 fl Final  . Neutrophils 03/07/2024 2.44  1.50 - 7.80 10^3/uL Final  . Lymphocytes 03/07/2024 2.48  1.00 - 3.60 10^3/uL Final  . Monocytes 03/07/2024 0.53  0.00 - 1.50 10^3/uL Final  . Eosinophils 03/07/2024 0.67 (H)  0.00 - 0.55  10^3/uL Final  . Basophils 03/07/2024 0.05  0.00 - 0.09 10^3/uL Final  . Neutrophil % 03/07/2024 39.5  32.0 - 70.0 % Final  . Lymphocyte % 03/07/2024 40.1  10.0 - 50.0 % Final  . Monocyte % 03/07/2024 8.6  4.0 - 13.0 % Final  . Eosinophil % 03/07/2024 10.8 (H)  1.0 - 5.0 % Final  . Basophil% 03/07/2024 0.8  0.0 - 2.0 % Final  . Immature Granulocyte % 03/07/2024 0.2  <=0.7 % Final  . Immature Granulocyte Count 03/07/2024 0.01  <=0.06 10^3/L Final  . Glucose 03/07/2024 259 (H)  70 - 110 mg/dL Final  . Sodium 92/84/7974 139  136 - 145 mmol/L Final  . Potassium 03/07/2024 4.8  3.6 - 5.1 mmol/L Final  . Chloride 03/07/2024 104  97 - 109 mmol/L Final  . Carbon Dioxide (CO2) 03/07/2024 32.5 (H)  22.0 - 32.0 mmol/L Final  . Urea  Nitrogen (BUN) 03/07/2024 12  7 - 25 mg/dL Final  . Creatinine 92/84/7974 0.8  0.7 - 1.3 mg/dL Final  . Glomerular Filtration Rate (eGFR) 03/07/2024 105  >60 mL/min/1.73sq m Final  . Calcium 03/07/2024 8.9  8.7 - 10.3 mg/dL Final  . AST  92/84/7974 37  8 - 39 U/L Final  . ALT  03/07/2024 33  6 - 57 U/L Final  . Alk Phos (alkaline Phosphatase) 03/07/2024 73  34 - 104 U/L Final  . Albumin 03/07/2024 3.7  3.5 - 4.8 g/dL Final  . Bilirubin, Total 03/07/2024 0.5  0.3 - 1.2 mg/dL Final  . Protein, Total 03/07/2024 6.0 (L)  6.1 - 7.9 g/dL Final  . A/G Ratio 92/84/7974 1.6  1.0 - 5.0 gm/dL Final  Office Visit on 01/24/2024  Component Date Value Ref Range Status  . WBC (White Blood Cell Count) 01/24/2024 9.7  4.1 - 10.2 10^3/uL Final  . RBC (Red Blood Cell Count) 01/24/2024 4.90  4.69 - 6.13 10^6/uL Final  . Hemoglobin 01/24/2024 13.7 (L)  14.1 - 18.1  gm/dL Final  . Hematocrit 93/97/7974 40.6  40.0 - 52.0 % Final  . MCV (Mean Corpuscular Volume) 01/24/2024 82.9  80.0 - 100.0 fl Final  . MCH (Mean Corpuscular Hemoglobin) 01/24/2024 28.0  27.0 - 31.2 pg Final  . MCHC (Mean Corpuscular Hemoglobin * 01/24/2024 33.7  32.0 - 36.0 gm/dL Final  . Platelet Count 01/24/2024 142 (L)  150 - 450 10^3/uL Final  . RDW-CV (Red Cell Distribution Widt* 01/24/2024 13.2  11.6 - 14.8 % Final  . MPV (Mean Platelet Volume) 01/24/2024 9.7  9.4 - 12.4 fl Final  . Neutrophils 01/24/2024 4.77  1.50 - 7.80 10^3/uL Final  . Lymphocytes 01/24/2024 3.45  1.00 - 3.60 10^3/uL Final  . Monocytes 01/24/2024 0.90  0.00 - 1.50 10^3/uL Final  . Eosinophils 01/24/2024 0.54  0.00 - 0.55 10^3/uL Final  . Basophils 01/24/2024 0.05  0.00 - 0.09 10^3/uL Final  . Neutrophil % 01/24/2024 49.1  32.0 - 70.0 % Final  . Lymphocyte % 01/24/2024 35.5  10.0 - 50.0 % Final  . Monocyte % 01/24/2024 9.2  4.0 - 13.0 % Final  . Eosinophil % 01/24/2024 5.5 (H)  1.0 - 5.0 % Final  . Basophil% 01/24/2024 0.5  0.0 - 2.0 % Final  . Immature Granulocyte % 01/24/2024 0.2  <=0.7 % Final  . Immature Granulocyte Count 01/24/2024 0.02  <=0.06 10^3/L Final  . Glucose 01/24/2024 208 (H)  70 - 110 mg/dL Final  . Sodium 93/97/7974 137  136 - 145 mmol/L Final  .  Potassium 01/24/2024 4.3  3.6 - 5.1 mmol/L Final  . Chloride 01/24/2024 97  97 - 109 mmol/L Final  . Carbon Dioxide (CO2) 01/24/2024 31.7  22.0 - 32.0 mmol/L Final  . Urea  Nitrogen (BUN) 01/24/2024 15  7 - 25 mg/dL Final  . Creatinine 93/97/7974 0.9  0.7 - 1.3 mg/dL Final  . Glomerular Filtration Rate (eGFR) 01/24/2024 101  >60 mL/min/1.73sq m Final  . Calcium 01/24/2024 9.6  8.7 - 10.3 mg/dL Final  . AST  93/97/7974 41 (H)  8 - 39 U/L Final  . ALT  01/24/2024 36  6 - 57 U/L Final  . Alk Phos (alkaline Phosphatase) 01/24/2024 77  34 - 104 U/L Final  . Albumin 01/24/2024 4.2  3.5 - 4.8 g/dL Final  . Bilirubin, Total 01/24/2024 0.7  0.3 - 1.2  mg/dL Final  . Protein, Total 01/24/2024 6.7  6.1 - 7.9 g/dL Final  . A/G Ratio 93/97/7974 1.7  1.0 - 5.0 gm/dL Final  . Sedimentation Rate-Automated 01/24/2024 7  0 - 20 mm/hr Final  . Thyroid Stimulating Hormone (TSH) 01/24/2024 2.658  0.450-5.330 uIU/ml uIU/mL Final  . Color 01/24/2024 Light Yellow  Colorless, Straw, Light Yellow, Yellow, Dark Yellow Final  . Clarity 01/24/2024 Clear  Clear Final  . Specific Gravity 01/24/2024 1.018  1.005 - 1.030 Final  . pH, Urine 01/24/2024 5.0  5.0 - 8.0 Final  . Protein, Urinalysis 01/24/2024 Negative  Negative mg/dL Final  . Glucose, Urinalysis 01/24/2024 Negative  Negative mg/dL Final  . Ketones, Urinalysis 01/24/2024 Negative  Negative mg/dL Final  . Blood, Urinalysis 01/24/2024 Negative  Negative Final  . Nitrite, Urinalysis 01/24/2024 Negative  Negative Final  . Leukocyte Esterase, Urinalysis 01/24/2024 Negative  Negative Final  . Bilirubin, Urinalysis 01/24/2024 Negative  Negative Final  . Urobilinogen, Urinalysis 01/24/2024 0.2  0.2 - 1.0 mg/dL Final  . WBC, UA 93/97/7974 2  <=5 /hpf Final  . Red Blood Cells, Urinalysis 01/24/2024 0  <=3 /hpf Final  . Bacteria, Urinalysis 01/24/2024 0-5  0 - 5 /hpf Final  . Squamous Epithelial Cells, Urinaly* 01/24/2024 0  /hpf Final  . Hemoglobin A1C 01/24/2024 9.3 (H)  4.2 - 5.6 % Final  . Average Blood Glucose (Calc) 01/24/2024 220  mg/dL Final  Office Visit on 09/08/2023  Component Date Value Ref Range Status  . Cholesterol, Total 09/08/2023 129  100 - 200 mg/dL Final  . Triglyceride 98/84/7974 109  35 - 199 mg/dL Final  . HDL (High Density Lipoprotein) Cho* 09/08/2023 45.8  29.0 - 71.0 mg/dL Final  . LDL Calculated 09/08/2023 61  0 - 130 mg/dL Final  . VLDL Cholesterol 09/08/2023 22  mg/dL Final  . Cholesterol/HDL Ratio 09/08/2023 2.8   Final  . WBC (White Blood Cell Count) 09/08/2023 9.6  4.1 - 10.2 10^3/uL Final  . RBC (Red Blood Cell Count) 09/08/2023 4.86  4.69 - 6.13 10^6/uL Final  .  Hemoglobin 09/08/2023 14.0 (L)  14.1 - 18.1 gm/dL Final  . Hematocrit 98/84/7974 40.6  40.0 - 52.0 % Final  . MCV (Mean Corpuscular Volume) 09/08/2023 83.5  80.0 - 100.0 fl Final  . MCH (Mean Corpuscular Hemoglobin) 09/08/2023 28.8  27.0 - 31.2 pg Final  . MCHC (Mean Corpuscular Hemoglobin * 09/08/2023 34.5  32.0 - 36.0 gm/dL Final  . Platelet Count 09/08/2023 158  150 - 450 10^3/uL Final  . RDW-CV (Red Cell Distribution Widt* 09/08/2023 13.0  11.6 - 14.8 % Final  . MPV (Mean Platelet Volume) 09/08/2023 9.9  9.4 -  12.4 fl Final  . Neutrophils 09/08/2023 4.69  1.50 - 7.80 10^3/uL Final  . Lymphocytes 09/08/2023 3.70 (H)  1.00 - 3.60 10^3/uL Final  . Monocytes 09/08/2023 0.68  0.00 - 1.50 10^3/uL Final  . Eosinophils 09/08/2023 0.43  0.00 - 0.55 10^3/uL Final  . Basophils 09/08/2023 0.04  0.00 - 0.09 10^3/uL Final  . Neutrophil % 09/08/2023 49.1  32.0 - 70.0 % Final  . Lymphocyte % 09/08/2023 38.7  10.0 - 50.0 % Final  . Monocyte % 09/08/2023 7.1  4.0 - 13.0 % Final  . Eosinophil % 09/08/2023 4.5  1.0 - 5.0 % Final  . Basophil% 09/08/2023 0.4  0.0 - 2.0 % Final  . Immature Granulocyte % 09/08/2023 0.2  <=0.7 % Final  . Immature Granulocyte Count 09/08/2023 0.02  <=0.06 10^3/L Final  . Glucose 09/08/2023 242 (H)  70 - 110 mg/dL Final  . Sodium 98/84/7974 137  136 - 145 mmol/L Final  . Potassium 09/08/2023 4.7  3.6 - 5.1 mmol/L Final  . Chloride 09/08/2023 101  97 - 109 mmol/L Final  . Carbon Dioxide (CO2) 09/08/2023 31.1  22.0 - 32.0 mmol/L Final  . Urea  Nitrogen (BUN) 09/08/2023 9  7 - 25 mg/dL Final  . Creatinine 98/84/7974 0.7  0.7 - 1.3 mg/dL Final  . Glomerular Filtration Rate (eGFR) 09/08/2023 109  >60 mL/min/1.73sq m Final  . Calcium 09/08/2023 9.5  8.7 - 10.3 mg/dL Final  . AST  98/84/7974 31  8 - 39 U/L Final  . ALT  09/08/2023 46  6 - 57 U/L Final  . Alk Phos (alkaline Phosphatase) 09/08/2023 76  34 - 104 U/L Final  . Albumin 09/08/2023 4.1  3.5 - 4.8 g/dL Final  . Bilirubin,  Total 09/08/2023 0.5  0.3 - 1.2 mg/dL Final  . Protein, Total 09/08/2023 6.9  6.1 - 7.9 g/dL Final  . A/G Ratio 98/84/7974 1.5  1.0 - 5.0 gm/dL Final  . PSA (Prostate Specific Antigen), T* 09/08/2023 0.38  0.10 - 4.00 ng/mL Final  . Thyroid Stimulating Hormone (TSH) 09/08/2023 1.546  0.450-5.330 uIU/ml uIU/mL Final  . Color 09/08/2023 Yellow  Colorless, Straw, Light Yellow, Yellow, Dark Yellow Final  . Clarity 09/08/2023 Clear  Clear Final  . Specific Gravity 09/08/2023 1.026  1.005 - 1.030 Final  . pH, Urine 09/08/2023 5.5  5.0 - 8.0 Final  . Protein, Urinalysis 09/08/2023 Trace (!)  Negative mg/dL Final  . Glucose, Urinalysis 09/08/2023 4+ (!)  Negative mg/dL Final  . Ketones, Urinalysis 09/08/2023 Negative  Negative mg/dL Final  . Blood, Urinalysis 09/08/2023 Negative  Negative Final  . Nitrite, Urinalysis 09/08/2023 Negative  Negative Final  . Leukocyte Esterase, Urinalysis 09/08/2023 Negative  Negative Final  . Bilirubin, Urinalysis 09/08/2023 Negative  Negative Final  . Urobilinogen, Urinalysis 09/08/2023 0.2  0.2 - 1.0 mg/dL Final  . WBC, UA 98/84/7974 <1  <=5 /hpf Final  . Red Blood Cells, Urinalysis 09/08/2023 1  <=3 /hpf Final  . Bacteria, Urinalysis 09/08/2023 0-5  0 - 5 /hpf Final  . Squamous Epithelial Cells, Urinaly* 09/08/2023 0  /hpf Final  . Hemoglobin A1C 09/08/2023 8.5 (H)  4.2 - 5.6 % Final  . Average Blood Glucose (Calc) 09/08/2023 197  mg/dL Final   DIAGNOSIS: HTN, goal below 140/90  (primary encounter diagnosis)  Uncontrolled type 2 diabetes mellitus with hyperglycemia, with long-term current use of insulin  (CMS/HHS-HCC)  Hyperlipidemia, acquired  Painful diabetic neuropathy (CMS/HHS-HCC)  Adjustment disorder with anxiety  Pain syndrome, chronic   PLAN:  No change in care. RTC 2 mo, sooner if needed     Attestation Statement:   I personally performed the service. (TP)  Reyes JONETTA Costa, MD, MD

## 2024-06-28 NOTE — Progress Notes (Signed)
 ENCOUNTER: Patient Class :No patient class for patient encounter Department: [facility] [address] [place] [address]  PATIENT: Patient Demographics      Name Patient ID SSN Gender Identity Birth Date   Brandin, Stetzer [id number] Male 13-Aug-2071 (53 yrs)          Address Phone Email       [address] [place] [address] [phone] ([phone] [place]) [phone] [phone]            [facility] Caucasian/White             Reg Status PCP Date Last Verified Next Review Date     Verified Auston Reyes BIRCH [phone] 06/02/24 07/02/24           Marital Status Religion Language       Married Unknown-Patient Declined English              EMERGENCY CONTACT: Name Relationship Lgl Grd Work Marine Scientist Phone  1. Strawderman,MELISSA Spouse    [phone]    GUARANTOR: There is no guarantor information entered for this encounter.  COVERAGE: Primary Visit Coverage      Payer Plan Group Number Group Name Payer Phone Plan Phone   No coverage found                Secondary Visit Coverage      Payer Plan Group Number Group Name Payer Phone Plan Phone   No coverage found                Primary Coverage      Payer Plan Group Number Group Name Payer Phone Plan Phone   [facility] MEDICARE [place] [facility] [place] MEDICARE ADVANTAGE [phone]   [phone]           Primary Subscriber      Subscriber ID Subscriber Name Subscriber [facility] Subscriber Address   [id number] Ditton,Jonothan D [id number] [address]      [place], [place] [address]           Secondary Coverage      Payer Plan Group Number Group Name Payer Phone Plan Phone   No coverage found

## 2024-07-23 ENCOUNTER — Emergency Department

## 2024-07-23 ENCOUNTER — Other Ambulatory Visit: Payer: Self-pay

## 2024-07-23 ENCOUNTER — Emergency Department: Admission: EM | Admit: 2024-07-23 | Discharge: 2024-07-23 | Discharge status: Against Medical Advice

## 2024-07-23 DIAGNOSIS — E1140 Type 2 diabetes mellitus with diabetic neuropathy, unspecified: Secondary | ICD-10-CM | POA: Diagnosis not present

## 2024-07-23 DIAGNOSIS — I10 Essential (primary) hypertension: Secondary | ICD-10-CM | POA: Insufficient documentation

## 2024-07-23 DIAGNOSIS — E1165 Type 2 diabetes mellitus with hyperglycemia: Secondary | ICD-10-CM | POA: Insufficient documentation

## 2024-07-23 DIAGNOSIS — Y900 Blood alcohol level of less than 20 mg/100 ml: Secondary | ICD-10-CM | POA: Insufficient documentation

## 2024-07-23 DIAGNOSIS — G4733 Obstructive sleep apnea (adult) (pediatric): Secondary | ICD-10-CM | POA: Insufficient documentation

## 2024-07-23 DIAGNOSIS — R296 Repeated falls: Secondary | ICD-10-CM | POA: Diagnosis not present

## 2024-07-23 DIAGNOSIS — R29810 Facial weakness: Secondary | ICD-10-CM | POA: Diagnosis present

## 2024-07-23 DIAGNOSIS — I4510 Unspecified right bundle-branch block: Secondary | ICD-10-CM | POA: Diagnosis not present

## 2024-07-23 DIAGNOSIS — Z5329 Procedure and treatment not carried out because of patient's decision for other reasons: Secondary | ICD-10-CM | POA: Diagnosis not present

## 2024-07-23 LAB — CBC
HCT: 42.1 % (ref 39.0–52.0)
Hemoglobin: 14 g/dL (ref 13.0–17.0)
MCH: 28.1 pg (ref 26.0–34.0)
MCHC: 33.3 g/dL (ref 30.0–36.0)
MCV: 84.5 fL (ref 80.0–100.0)
Platelets: 170 K/uL (ref 150–400)
RBC: 4.98 MIL/uL (ref 4.22–5.81)
RDW: 12.8 % (ref 11.5–15.5)
WBC: 7.4 K/uL (ref 4.0–10.5)
nRBC: 0 % (ref 0.0–0.2)

## 2024-07-23 LAB — COMPREHENSIVE METABOLIC PANEL WITH GFR
ALT: 17 U/L (ref 0–44)
AST: 32 U/L (ref 15–41)
Albumin: 4.2 g/dL (ref 3.5–5.0)
Alkaline Phosphatase: 76 U/L (ref 38–126)
Anion gap: 11 (ref 5–15)
BUN: 10 mg/dL (ref 6–20)
CO2: 27 mmol/L (ref 22–32)
Calcium: 9.7 mg/dL (ref 8.9–10.3)
Chloride: 103 mmol/L (ref 98–111)
Creatinine, Ser: 0.71 mg/dL (ref 0.61–1.24)
GFR, Estimated: >60 mL/min (ref >60)
Glucose, Bld: 187 mg/dL — ABNORMAL HIGH (ref 70–99)
Potassium: 4.4 mmol/L (ref 3.5–5.1)
Sodium: 140 mmol/L (ref 135–145)
Total Bilirubin: 0.6 mg/dL (ref 0.0–1.2)
Total Protein: 7.3 g/dL (ref 6.5–8.1)

## 2024-07-23 LAB — DIFFERENTIAL
Abs Immature Granulocytes: 0.01 K/uL (ref 0.00–0.07)
Basophils Absolute: 0 K/uL (ref 0.0–0.1)
Basophils Relative: 0 %
Eosinophils Absolute: 0.3 K/uL (ref 0.0–0.5)
Eosinophils Relative: 4 %
Immature Granulocytes: 0 %
Lymphocytes Relative: 28 %
Lymphs Abs: 2.1 K/uL (ref 0.7–4.0)
Monocytes Absolute: 0.4 K/uL (ref 0.1–1.0)
Monocytes Relative: 5 %
Neutro Abs: 4.6 K/uL (ref 1.7–7.7)
Neutrophils Relative %: 63 %

## 2024-07-23 LAB — APTT: aPTT: 31 s (ref 24–36)

## 2024-07-23 LAB — PROTIME-INR
INR: 1 (ref 0.8–1.2)
Prothrombin Time: 14.2 s (ref 11.4–15.2)

## 2024-07-23 LAB — CBG MONITORING, ED: Glucose-Capillary: 162 mg/dL — ABNORMAL HIGH (ref 70–99)

## 2024-07-23 LAB — ETHANOL: Alcohol, Ethyl (B): <15 mg/dL (ref <15)

## 2024-07-23 NOTE — ED Triage Notes (Signed)
 Pt noticed a facial droop yesterday evening, but cannot recall a time period. Pt presents with L side facial droop and unable to close his left eye. Pt denies any sensation deficits. Pt says he has been falling a lot for the past 2 weeks. Pt says he is off balance. Pt says he has had Bell's Palsy in the past.

## 2024-07-23 NOTE — ED Provider Notes (Signed)
 [facility] Emergency Department Provider Note     Event Date/Time   First MD Initiated Contact with Patient 07/23/24 1514     (approximate)   History   Facial Droop   HPI  Wife is at bedside to provide interim HPI due to patient's short-term memory issues.  Brian Howell is a 53 y.o. male with a history of HTN, HLD, DM type II, anxiety, OSA on CPAP, peripheral neuropathy, diabetic neuropathy, obesity, short-term memory loss, dissociative disorder, and balance issues, presents to the ED companied by his wife.  Patient presents with complaints of facial droop that he believes he noted yesterday evening.  His wife would endorse that she along with daughter, thought that Thursday his mouth seem to be uneven, and he had a hard time pronouncing some words.  Wife would also endorse that the patient has had some increased falls due to his chronic history of balance issues.  He has had to pull out his single-point cane over the last 2 weeks per her recollection.  Patient is describing some numbness to the left side of his forehead.  He would also report that he feels like he has been difficult to swallow his foods, but denies any frank choking, gagging, or cough induced vomiting.  He denies any vision change, tinnitus, vertigo, dizziness, or hearing loss.  The wife of report the patient appears at baseline in regards to his ability to communicate, follow commands, and cognition.  Patient gives a remote history of Bell's palsy some 20 years ago, with spontaneously resolving symptoms after a few days.  No reports of any gross grip changes, paresthesias above baseline, vision loss, bladder or bowel incontinence, foot drop, or saddle anesthesias.     Physical Exam   Triage Vital Signs: ED Triage Vitals  Encounter Vitals Group     BP 07/23/24 1420 (!) 145/73     Girls Systolic BP Percentile --      Girls Diastolic BP Percentile --      Boys Systolic BP Percentile --       Boys Diastolic BP Percentile --      Pulse Rate 07/23/24 1420 69     Resp 07/23/24 1420 17     Temp 07/23/24 1420 98.5 F (36.9 C)     Temp Source 07/23/24 1420 Oral     SpO2 07/23/24 1420 95 %     Weight 07/23/24 1421 285 lb (129.3 kg)     Height 07/23/24 1421 5' 10 (1.778 m)     Head Circumference --      Peak Flow --      Pain Score 07/23/24 1421 0     Pain Loc --      Pain Education --      Exclude from Growth Chart --     Most recent vital signs: Vitals:   07/23/24 1420  BP: (!) 145/73  Pulse: 69  Resp: 17  Temp: 98.5 F (36.9 C)  SpO2: 95%    General Awake, no distress. NAD A&O x 4 HEENT NCAT. PERRL. EOMI. No rhinorrhea. Mucous membranes are moist.  TMs intact bilaterally without evidence of purulent or serous effusions.  Uvula is midline and tonsils are flat. CV:  Good peripheral perfusion.  RRR RESP:  Normal effort.  CTA ABD:  No distention.  Soft and nontender MSK:  AROM of all extremities.  5 out of 5 strength to the R UE/LE; 4-5 strength to the left UE/LE NEURO: Cranial nerves  II to XII grossly intact.  Normal gross sensation bilaterally.  Patient with evidence of lid lag on the left upper lid.  Patient with decreased forehead wrinkles with brow lift.  Patient with downgoing mild on the left.  Tongue is midline.  No pronator drift is appreciated.  Normal finger-to-nose exam.  Normal rapid alternating movements on exam.  No cerebellar ataxia appreciated.   ED Results / Procedures / Treatments   Labs (all labs ordered are listed, but only abnormal results are displayed) Labs Reviewed  COMPREHENSIVE METABOLIC PANEL WITH GFR - Abnormal; Notable for the following components:      Result Value   Glucose, Bld 187 (*)    All other components within normal limits  CBG MONITORING, ED - Abnormal; Notable for the following components:   Glucose-Capillary 162 (*)    All other components within normal limits  PROTIME-INR  APTT  CBC  DIFFERENTIAL  ETHANOL      EKG  Vent. rate 62 BPM  PR interval 160 ms  QRS duration 152 ms  QT/QTcB 478/485 ms  P-R-T axes 23 21 31   Normal sinus rhythm  Right bundle branch block  No STEMI  RADIOLOGY  I personally viewed and evaluated these images as part of my medical decision making, as well as reviewing the written report by the radiologist.  ED Provider Interpretation: no acute CT findings  CT HEAD WO CONTRAST Result Date: 07/23/2024 CLINICAL DATA:  Neuro deficit, acute, stroke suspected. Facial droop beginning yesterday, left-sided. EXAM: CT HEAD WITHOUT CONTRAST TECHNIQUE: Contiguous axial images were obtained from the base of the skull through the vertex without intravenous contrast. RADIATION DOSE REDUCTION: This exam was performed according to the departmental dose-optimization program which includes automated exposure control, adjustment of the mA and/or kV according to patient size and/or use of iterative reconstruction technique. COMPARISON:  MRI 01/31/2024 FINDINGS: Brain: The brain shows a normal appearance without evidence of malformation, atrophy, old or acute small or large vessel infarction, mass lesion, hemorrhage, hydrocephalus or extra-axial collection. Vascular: No hyperdense vessel. No evidence of atherosclerotic calcification. Skull: Normal.  No traumatic finding.  No focal bone lesion. Sinuses/Orbits: Sinuses are clear. Orbits appear normal. Mastoids are clear. Other: None significant IMPRESSION: Normal head CT. Electronically Signed   By: Oneil Officer M.D.   On: 07/23/2024 14:48    PROCEDURES:  Critical Care performed: No  Procedures   MEDICATIONS ORDERED IN ED: Medications - No data to display   IMPRESSION / MDM / ASSESSMENT AND PLAN / ED COURSE  I reviewed the triage vital signs and the nursing notes.                              Differential diagnosis includes, but is not limited to, intracranial hemorrhage, meningitis/encephalitis, previous head trauma, cavernous  venous thrombosis, migraine or migraine equivalent, idiopathic intracranial hypertension, facial nerve palsy, TIA, electrolyte abnormality, metabolic encephalopathy.  Patient's presentation is most consistent with acute presentation with potential threat to life or bodily function.  Patient's diagnosis is consistent with left-sided facial weakness as well as increased falls in the last 2 weeks, above his baseline balance issues.  Patient was last known well approximately 3 days prior to arrival.  He would endorse some facial weakness on the left side as well as some difficulty forming words secondary to mouth and lip weakness.  No reports of any distal paresthesias, frank weakness, vision change.  Concern is for an acute  facial nerve palsy versus evidence of recent intracranial infarct.  Initial plain CT images interpreted by me, showed no evidence of hemorrhagic stroke.  Patient's labs are overall unremarkable at this time with normal clotting factors, normal cell counts, and reassuring chemistries.  No EKG evidence of malignant arrhythmia.  PT/INR within normal limits.   ----------------------------------------- 5:29 PM on 07/23/2024 -----------------------------------------  Patient apparently eloped from the ED treatment room just prior to MRI coming to receive the patient.  Patient departed with his wife in no acute distress after 72+ hours of reported left-sided facial weakness.  He advised the RN via telephone follow-up that he will see his primary provider on Thursday as scheduled.  Clinical Course as of 07/23/24 1728  Sun Jul 23, 2024  1717 MRI tech at room to retrieve patient, but patient and wife have apparently eloped from the room.  No evidence of any personal effects in the room. [JM]  1727 RN made telephone contact with the patient and his wife, who confirmed today have left the premises ahead of completing care, to follow-up with the PCP on Thursday as scheduled.  RN informed patient  that MRI was pending, they declined returning to the ED at this time. [JM]    Clinical Course User Index [JM] Jeremey Bascom, Candida LULLA Kings, PA-C    FINAL CLINICAL IMPRESSION(S) / ED DIAGNOSES   Final diagnoses:  Facial weakness     Rx / DC Orders   ED Discharge Orders     None        Note:  This document was prepared using Dragon voice recognition software and may include unintentional dictation errors.    Loyd Candida LULLA Kings, PA-C 07/23/24 1730    Clarine Ozell LABOR, MD 07/25/24 [phone]

## 2024-07-23 NOTE — ED Notes (Signed)
 MRI came to take pt for scan. Pt not found in room. This RN attempted to call patient twice without answer. Per MRI they spoke to the patient around 5pm for screening questions.

## 2024-07-23 NOTE — ED Notes (Signed)
 Pt stated that he decided to leave. Stated that he has a Dr. Astronomer on Thursday and did not want to come back for his MRI.

## 2024-07-28 ENCOUNTER — Other Ambulatory Visit: Payer: Self-pay | Admitting: Internal Medicine

## 2024-07-28 DIAGNOSIS — G510 Bell's palsy: Secondary | ICD-10-CM

## 2024-08-03 ENCOUNTER — Ambulatory Visit
Admission: RE | Admit: 2024-08-03 | Discharge: 2024-08-03 | Disposition: A | Discharge status: Home / Self Care | Source: Ambulatory Visit | Attending: Internal Medicine | Admitting: Internal Medicine

## 2024-08-03 DIAGNOSIS — G510 Bell's palsy: Secondary | ICD-10-CM | POA: Diagnosis present

## 2024-08-23 NOTE — Progress Notes (Signed)
 [facility] MD/PA/NP OP Progress Note  08/28/2024 10:02 AM Brian Howell  MRN:  [id number]  Chief Complaint:  Chief Complaint  Patient presents with   Follow-up   HPI:  Brian Howell is a 53 y.o. year old male with a history of depression, anxiety, dissociative disorder, hypertension, hyperlipidemia, diabetes, sleep apnea on CPAP, who presents to re-establish care.  he was seen by this clinical research associate, last in [place] 2024  He states that he was unable to make follow-up due to financial strain.  He states that he has been feeling more agitated.  He feels frustrated as he cannot do certain things, although he used to be a bread winner.  When he is asked about his daily routine, he states that he does not remember it.  He spends time watching TV.  He does not go outside as much as he cannot provide people.  The noise bothers him.  He also reports agoraphobia in the last few years, this has been worsening lately.  He does not know how to [place] anymore.  He does not drive.  He feels so uneasy and tends to hyperventilate.  He also reports dissociate all the time.  He states that there are a lot of triggers to it, including waiting hours for the appointment.  He has less appetite, although he has gained some weight.  He reports anhedonia, stating that he has no interest in doing anything.  Although he does enjoy making wood work, he has issues with his hands, and he feels his hands are not connecting with his brain. He reports SI, stating that he feels useless.  He has never acted on it and denies any plan or intent.  He feels a burden to his family.   PTSD-he denies any abuse.  Although he thinks his parents did the best they could that time, he reports lack of emotional support.  His father was working many hours.  His mother struggled with some illness.  He reports limited relationship with his two siblings left. He thinks they are scared of him due to him having episode of dissociation. Of note, he reports  traumatic episode during christmas, where he could not recognize his niece that time. (Which made him feel stupid) He went upstairs in a closet, crying. He feels vulnerable due to his memory issues.   Irritability-he reports loss of control.  Although he had HI on some moment, he denies any intent to act on it.  He shares an example of him coming across with a man who smoke in his face (disrespectful). This person apologized to him, and he denies any aggression.   He reports episodes of spending some money on items.  He denies decreased need for sleep or euphoria.  Although he does reorganizing tired Kitchen on some night due to insomnia, he denies decreased need for sleep.   Hallucinations-he reports AH of hearing noises in the house.  He feels like people are talking.  He has VH of feeling that somebody is in the house.  He also reports seeing his sister, who passed away several years ago.  These are scary to him at times.   Medication- duloxetine  60 mg daily. Although he thought he might be taking Abilify, pharmacy teacher, adult education and [facility]) deny any recent refill).    Brian Howell, his son presents to the visit with the patient consent.  He sess that [place] has episodes of depression, which has been worse.  On good days, he is able to get out  of the house and go to the thrift store.  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  It is sometimes difficult due to his joint pain and lack of transportation.  There is concern of him buying items he does not need, this feeds into it out as well.  He does not think it is manic spending and is more controlled, although it is distressing due to the fact of it happened regardless of how much she spent.  He tends to sleep a lot when he appears to be depressed.  He can be snappy, although there is no known violence. Brian Howell thinks these episode worsened after the injury a few years ago.   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 Moderate episode of recurrent major depressive disorder (HCC)  F33.1     2. Dissociative disorder  F44.9       Past Psychiatric History: Please see initial evaluation for full details. I have reviewed the history. No updates at this time.     Past Medical History:  Past Medical History:  Diagnosis Date   Anxiety    Arthritis    Diabetes mellitus, type II (HCC)    Dysplastic nevus 10/16/2020   Left post. thigh. Moderate to severe atypia, lateral margin involved.  excised 11/19/20   Headache    Hypertension    Memory deficit    Neuropathy    Sleep apnea    cpap    Past Surgical History:  Procedure Laterality Date   CHOLECYSTECTOMY     COLONOSCOPY WITH PROPOFOL  N/A 11/09/2022   Procedure: COLONOSCOPY WITH PROPOFOL ;  Surgeon: Maryruth Ole DASEN, MD;  Location: [facility] ENDOSCOPY;  Service: Endoscopy;  Laterality: N/A;   ESOPHAGOGASTRODUODENOSCOPY (EGD) WITH PROPOFOL  N/A 11/09/2022   Procedure: ESOPHAGOGASTRODUODENOSCOPY (EGD) WITH PROPOFOL ;  Surgeon: Maryruth Ole DASEN, MD;  Location: [facility] ENDOSCOPY;  Service: Endoscopy;  Laterality: N/A;   KNEE ARTHROPLASTY Right 06/17/2020   Procedure: COMPUTER ASSISTED TOTAL KNEE ARTHROPLASTY;  Surgeon: Mardee Lynwood SQUIBB, MD;  Location: [facility] ORS;  Service: Orthopedics;  Laterality: Right;   KNEE ARTHROSCOPY Right    meniscus   LEFT HEART CATH AND CORONARY ANGIOGRAPHY N/A 01/16/2022   Procedure: LEFT HEART CATH AND CORONARY ANGIOGRAPHY;  Surgeon: Lawyer Bernardino Cough, MD;  Location: [facility] INVASIVE CV LAB;  Service: Cardiovascular;  Laterality: N/A;    Family Psychiatric History: Please see initial evaluation for full details. I have reviewed the  history. No updates at this time.     Family History:  Family History  Problem Relation Age of Onset   Anxiety disorder Mother    Depression Mother    Diabetes Mother    Heart attack Father    Stroke Father    Schizophrenia Sister    Bipolar disorder Sister    Diabetes Sister    Diabetes Sister    Stroke Sister    Heart attack Sister    Diabetes Brother     Social History:  Social History   Socioeconomic History   Marital status: Married    Spouse name: eleanor   Number of children: 4   Years  of education: Not on file   Highest education level: Associate degree: occupational, technical, or vocational program  Occupational History   Not on file  Tobacco Use   Smoking status: Never   Smokeless tobacco: Never  Vaping Use   Vaping status: Never Used  Substance and Sexual Activity   Alcohol  use: No    Alcohol /week: 0.0 standard drinks of alcohol    Drug use: No   Sexual activity: Not Currently  Other Topics Concern   Not on file  Social History Narrative   Not on file   Social Drivers of Health   Tobacco Use: Low Risk (08/28/2024)   Patient History    Smoking Tobacco Use: Never    Smokeless Tobacco Use: Never    Passive Exposure: Not on file  Financial Resource Strain: High Risk (04/05/2024)   Received from [facility]   Overall Financial Resource Strain (CARDIA)    Difficulty of Paying Living Expenses: Hard  Food Insecurity: Food Insecurity Present (04/05/2024)   Received from [facility]   Epic    Within the past 12 months, you worried that your food would run out before you got the money to buy more.: Sometimes true    Within the past 12 months, the food you bought just didn't last and you didn't have money to get more.: Sometimes true  Transportation Needs: No Transportation Needs (04/05/2024)   Received from [facility] - Transportation    In the past 12 months, has lack of transportation kept  you from medical appointments or from getting medications?: No    Lack of Transportation (Non-Medical): No  Physical Activity: Not on file  Stress: Not on file  Social Connections: Not on file  Depression (PHQ2-9): High Risk (11/16/2022)   Depression (PHQ2-9)    PHQ-2 Score: 24  Alcohol  Screen: Not on file  Housing: Low Risk  (04/05/2024)   Received from [facility]   Epic    In the last 12 months, was there a time when you were not able to pay the mortgage or rent on time?: No    In the past 12 months, how many times have you moved where you were living?: 0    At any time in the past 12 months, were you homeless or living in a shelter (including now)?: No  Utilities: Not At Risk (04/05/2024)   Received from [facility]   Epic    In the past 12 months has the electric, gas, oil, or water company threatened to shut off services in your home?: No  Health Literacy: Not on file    Allergies: Allergies[1]  Metabolic Disorder Labs: No results found for: HGBA1C, MPG No results found for: PROLACTIN No results found for: CHOL, TRIG, HDL, CHOLHDL, VLDL, LDLCALC No results found for: TSH  Therapeutic Level Labs: No results found for: LITHIUM No results found for: VALPROATE No results found for: CBMZ  Current Medications: Current Outpatient Medications  Medication Sig Dispense Refill   buPROPion  (WELLBUTRIN  XL) 150 MG 24 hr tablet Take 1 tablet (150 mg total) by mouth daily. 30 tablet 1   Cyanocobalamin (B-12 COMPLIANCE INJECTION) 1000 MCG/ML KIT Inject 1,000 mcg as directed every 30 (thirty) days.     DULoxetine  (CYMBALTA ) 60 MG capsule Take 60 mg by mouth daily.     enoxaparin  (LOVENOX ) 40 MG/0.4ML injection Inject 0.4 mLs (40 mg total) into the skin daily for 14 days. 5.6 mL  0   ferrous sulfate  325 (65 FE) MG tablet Take by mouth.     furosemide  (LASIX ) 20 MG tablet Take by mouth.     HYDROCORTISONE , TOPICAL, (TEXACORT ) 2.5  % SOLN Apply 1 application topically 3 (three) times a week. Apply to rash in beard area 3 nights a week 30 mL 3   hydrOXYzine  (ATARAX /VISTARIL ) 25 MG tablet Take 25 mg by mouth 3 (three) times daily.      insulin  NPH Human (NOVOLIN N) 100 UNIT/ML injection Inject 35-75 Units into the skin See admin instructions. Inject 75 units in the morning and 35 units at night     insulin  regular (NOVOLIN R) 100 units/mL injection Inject 35-55 Units into the skin See admin instructions. Inject 55 units with breakfast, 35 units at lunch time, and 55 units with dinner  Sliding scale     Insulin  Syringe-Needle U-100 (INSULIN  SYRINGE .3CC/29GX1/2) 29G X 1/2 0.3 ML MISC      ketoconazole  (NIZORAL ) 2 % shampoo Apply 1 application topically 3 (three) times a week. Wash scalp, axilla and beard area 3 times weekly let sit 5 minutes and rinse out 120 mL 3   losartan  (COZAAR ) 25 MG tablet Take 25 mg by mouth 2 (two) times daily.     Melatonin 10 MG CAPS Take 10 mg by mouth at bedtime.     metFORMIN  (GLUCOPHAGE ) 1000 MG tablet Take 1,000 mg by mouth 2 (two) times daily.     mupirocin  ointment (BACTROBAN ) 2 % Apply 1 application topically daily. Qd to excision site 22 g 0   oxyCODONE  (OXY IR/ROXICODONE ) 5 MG immediate release tablet Take 1 tablet (5 mg total) by mouth every 4 (four) hours as needed for moderate pain (pain score 4-6). 30 tablet 0   urea  (CARMOL) 40 % CREA Apply to the neck QD. 198 g 1   No current facility-administered medications for this visit.     Musculoskeletal: Strength & Muscle Tone: within normal limits Gait & Station: normal Patient leans: N/A  Psychiatric Specialty Exam: Review of Systems  Psychiatric/Behavioral:  Positive for decreased concentration, dysphoric mood, hallucinations, sleep disturbance and suicidal ideas. Negative for agitation, behavioral problems, confusion and self-injury. The patient is nervous/anxious. The patient is not hyperactive.   All other systems reviewed and  are negative.   Blood pressure 138/78, pulse 78, temperature 97.6 F (36.4 C), temperature source Temporal, height 5' 10 (1.778 m), weight 293 lb (132.9 kg).Body mass index is 42.04 kg/m.  General Appearance: Well Groomed  Eye Contact:  Good  Speech:  Clear and Coherent  Volume:  Normal  Mood:  Depressed  Affect:  Appropriate, Congruent, and Restricted  Thought Process:  Coherent  Orientation:  Full (Time, Place, and Person)  Thought Content: Logical   Suicidal Thoughts:  Yes, denies intent, plan  Homicidal Thoughts:  No  Memory:  Immediate;   Good  Judgement:  Good  Insight:  Fair  Psychomotor Activity:  Normal  Concentration:  Concentration: Good and Attention Span: Good  Recall:  Good  Fund of Knowledge: Good  Language: Good  Akathisia:  No  Handed:  Right  AIMS (if indicated): not done  Assets:  Communication Skills Desire for Improvement  ADL's:  Intact  Cognition: WNL  Sleep:  hypersomnia   Screenings: GAD-7    Flowsheet Row Office Visit from 11/16/2022 in [facility] Office Visit from 09/14/2022 in [facility]  Total GAD-7 Score 16 14   PHQ2-9  Flowsheet Row Office Visit from 11/16/2022 in [facility] Office Visit from 09/14/2022 in [facility]  PHQ-2 Total Score 6 6  PHQ-9 Total Score 24 22   Flowsheet Row ED from 07/23/2024 in [facility] Visit from 11/16/2022 in [facility] Admission (Discharged) from 11/09/2022 in [facility]  C-SSRS RISK CATEGORY No Risk Error: Q3, 4, or 5 should not be populated when Q2 is No No Risk     Assessment and Plan:  Peretz Thieme is a 53 y.o. year old male with a history of depression, anxiety, dissociative disorder, hypertension, hyperlipidemia,  diabetes, sleep apnea on CPAP, who presents for follow up appointment for below.   1. Moderate episode of recurrent major depressive disorder (HCC) 2. Dissociative disorder He is demoralized due to memory loss/dissociative disorder, unemployment. He reports absence of nurturing in childhood, although he denies any abuse.    History: no history of significant mood symptoms prior to having head injury in 2007 (he accidentally fell from a truck and hit his head on the concrete).  He was reportedly evaluated during [facility] admission, and was diagnosed with dissociative cognitive disorder.  He was last seen at [facility] psychiatry in 2021.   He experiences depressive symptoms for several months, which has been unchanged from the previous visit.  He is demoralized due to memory loss/dissociation, and loss of ability to function, partly attributed to his pain.  It is noted that his irritability appears to be more manageable, although it is still present.  Will start bupropion  this time adjunctive treatment for depression/target anhedonia.  Discussed potential risk of headache, insomnia, worsening in anxiety.  He denies any history of seizure.  Noted that although he reports some paranoia/micro psychotic symptoms of hallucinations, will not start psychotropics at this time to reduce risk of QTc prolongation.  He is receptive to behavioral activation.  He will greatly benefit from CBT; will make a referral.    Plan Start bupropion  150 mg daily  Continue duloxetine  60 mg daily (limited benefit from 90 mg) Next appointment: 2/16 at 8 AM, IP Referral to therapy onsite - TSH wnl in 08/2022. Currently on vitamin B12, last checked in 2023 - on mounjaro - EKG QTc 485 msec, HR 62 07/2024  Past trials: sertraline, lexapro , fluoxetine, mirtazapine, Abilify   The patient demonstrates the following risk factors for suicide: Chronic risk factors for suicide include: psychiatric disorder of depression . Acute risk factors for  suicide include: unemployment. Protective factors for this patient include: positive social support and hope for the future. Considering these factors, the overall suicide risk at this point appears to be low. Patient is appropriate for outpatient follow up. Emergency resources which includes 911, ED, suicide crisis line (988) are discussed.  He denies gun access at home.   A total of 65 minutes was spent on the following activities during the encounter date, which includes but is not limited to: preparing to see the patient (e.g., reviewing tests and records), obtaining and/or reviewing separately obtained history, performing a medically necessary examination or evaluation, counseling and educating the patient, family, or caregiver, ordering medications, tests, or procedures, referring and communicating with other healthcare professionals (when not reported separately), documenting clinical information in the electronic or paper health record, independently interpreting test or lab results and communicating these results to the family or caregiver, and coordinating care (when not reported separately).  Collaboration of Care: Collaboration of Care: Other reviewed notes in Epic  Patient/Guardian was advised Release of Information must be obtained prior to any record release in order to collaborate their care with an outside provider. Patient/Guardian was advised if they have not already done so to contact the registration department to sign all necessary forms in order for us  to release information regarding their care.   Consent: Patient/Guardian gives verbal consent for treatment and assignment of benefits for services provided during this visit. Patient/Guardian expressed understanding and agreed to proceed.    Katheren Sleet, MD 08/28/2024, 10:02 AM     [1]  Allergies Allergen Reactions   Ace Inhibitors Swelling    Lips and face   Gabapentin Rash

## 2024-08-28 ENCOUNTER — Encounter: Payer: Self-pay | Admitting: Psychiatry

## 2024-08-28 ENCOUNTER — Other Ambulatory Visit: Payer: Self-pay

## 2024-08-28 ENCOUNTER — Ambulatory Visit: Admitting: Psychiatry

## 2024-08-28 VITALS — BP 138/78 | HR 78 | Temp 97.6°F | Ht 70.0 in | Wt 293.0 lb

## 2024-08-28 DIAGNOSIS — F449 Dissociative and conversion disorder, unspecified: Secondary | ICD-10-CM

## 2024-08-28 DIAGNOSIS — F331 Major depressive disorder, recurrent, moderate: Secondary | ICD-10-CM | POA: Diagnosis not present

## 2024-08-28 MED ORDER — BUPROPION HCL ER (XL) 150 MG PO TB24: 150.0000 mg | ORAL_TABLET | Freq: Every day | ORAL | 1 refills | Status: AC

## 2024-08-28 NOTE — Patient Instructions (Signed)
 Start bupropion  150 mg daily  Continue duloxetine  60 mg daily  Next appointment: 2/16 at 8 AM

## 2024-09-25 ENCOUNTER — Telehealth: Payer: Self-pay

## 2024-09-25 NOTE — Telephone Encounter (Signed)
 Called patient to verify pharmacy received a fax for a refill from [facility] home delivery for a refill of Bupropion   but is not listed in patients chart as his preferred pharmacy patient was not available per the person who did answer the phone voiced that I would call back to speak to the patient when is he available around 12 noon

## 2024-10-09 ENCOUNTER — Ambulatory Visit: Admitting: Psychiatry
